# Patient Record
Sex: Female | Born: 1935 | Race: White | Hispanic: No | Marital: Married | Smoking: Never smoker
Health system: Southern US, Community
[De-identification: ages and names within clinical notes are randomized; demographics above are authoritative.]

## PROBLEM LIST (undated history)

## (undated) HISTORY — PX: VARICOSE VEIN SURGERY: SHX832

## (undated) HISTORY — PX: TONSILLECTOMY: SUR1361

---

## 1957-05-05 HISTORY — PX: THYROIDECTOMY: SHX17

## 1999-12-02 ENCOUNTER — Other Ambulatory Visit: Admission: RE | Admit: 1999-12-02 | Discharge: 1999-12-02 | Payer: Self-pay | Admitting: Internal Medicine

## 2000-02-14 ENCOUNTER — Encounter: Admission: RE | Admit: 2000-02-14 | Discharge: 2000-02-14 | Payer: Self-pay | Admitting: Internal Medicine

## 2000-02-14 ENCOUNTER — Encounter: Payer: Self-pay | Admitting: Internal Medicine

## 2001-04-26 ENCOUNTER — Encounter: Admission: RE | Admit: 2001-04-26 | Discharge: 2001-04-26 | Payer: Self-pay | Admitting: Internal Medicine

## 2001-04-26 ENCOUNTER — Encounter: Payer: Self-pay | Admitting: Internal Medicine

## 2002-05-02 ENCOUNTER — Encounter: Admission: RE | Admit: 2002-05-02 | Discharge: 2002-05-02 | Payer: Self-pay | Admitting: Internal Medicine

## 2002-05-02 ENCOUNTER — Encounter: Payer: Self-pay | Admitting: Internal Medicine

## 2004-03-01 ENCOUNTER — Other Ambulatory Visit: Admission: RE | Admit: 2004-03-01 | Discharge: 2004-03-01 | Payer: Self-pay | Admitting: Family Medicine

## 2004-03-21 ENCOUNTER — Ambulatory Visit: Payer: Self-pay | Admitting: Internal Medicine

## 2004-05-20 ENCOUNTER — Ambulatory Visit: Payer: Self-pay | Admitting: Internal Medicine

## 2004-06-06 ENCOUNTER — Ambulatory Visit: Payer: Self-pay | Admitting: Gastroenterology

## 2004-06-06 ENCOUNTER — Encounter: Admission: RE | Admit: 2004-06-06 | Discharge: 2004-06-06 | Payer: Self-pay | Admitting: Internal Medicine

## 2004-06-24 ENCOUNTER — Encounter (INDEPENDENT_AMBULATORY_CARE_PROVIDER_SITE_OTHER): Payer: Self-pay | Admitting: *Deleted

## 2004-06-24 ENCOUNTER — Encounter: Admission: RE | Admit: 2004-06-24 | Discharge: 2004-06-24 | Payer: Self-pay | Admitting: Internal Medicine

## 2004-07-03 ENCOUNTER — Encounter: Admission: RE | Admit: 2004-07-03 | Discharge: 2004-07-03 | Payer: Self-pay | Admitting: General Surgery

## 2004-07-04 ENCOUNTER — Encounter: Admission: RE | Admit: 2004-07-04 | Discharge: 2004-07-04 | Payer: Self-pay | Admitting: General Surgery

## 2004-07-08 ENCOUNTER — Encounter (INDEPENDENT_AMBULATORY_CARE_PROVIDER_SITE_OTHER): Payer: Self-pay | Admitting: *Deleted

## 2004-07-08 ENCOUNTER — Encounter: Admission: RE | Admit: 2004-07-08 | Discharge: 2004-07-08 | Payer: Self-pay | Admitting: General Surgery

## 2004-07-08 ENCOUNTER — Ambulatory Visit (HOSPITAL_COMMUNITY): Admission: RE | Admit: 2004-07-08 | Discharge: 2004-07-08 | Payer: Self-pay | Admitting: General Surgery

## 2004-07-08 ENCOUNTER — Ambulatory Visit (HOSPITAL_BASED_OUTPATIENT_CLINIC_OR_DEPARTMENT_OTHER): Admission: RE | Admit: 2004-07-08 | Discharge: 2004-07-08 | Payer: Self-pay | Admitting: General Surgery

## 2004-07-09 ENCOUNTER — Ambulatory Visit: Payer: Self-pay | Admitting: Oncology

## 2004-07-22 ENCOUNTER — Ambulatory Visit: Admission: RE | Admit: 2004-07-22 | Discharge: 2004-09-17 | Payer: Self-pay | Admitting: *Deleted

## 2004-09-27 ENCOUNTER — Ambulatory Visit: Payer: Self-pay | Admitting: Oncology

## 2005-01-16 ENCOUNTER — Encounter: Admission: RE | Admit: 2005-01-16 | Discharge: 2005-01-16 | Payer: Self-pay | Admitting: General Surgery

## 2005-03-19 ENCOUNTER — Ambulatory Visit: Payer: Self-pay | Admitting: Family Medicine

## 2005-06-23 ENCOUNTER — Encounter: Admission: RE | Admit: 2005-06-23 | Discharge: 2005-06-23 | Payer: Self-pay | Admitting: Oncology

## 2005-08-11 ENCOUNTER — Ambulatory Visit: Payer: Self-pay | Admitting: Internal Medicine

## 2005-09-25 ENCOUNTER — Ambulatory Visit: Payer: Self-pay | Admitting: Oncology

## 2005-11-11 ENCOUNTER — Ambulatory Visit: Payer: Self-pay | Admitting: Oncology

## 2005-11-11 LAB — CBC WITH DIFFERENTIAL/PLATELET
EOS%: 2.7 % (ref 0.0–7.0)
Eosinophils Absolute: 0.1 10*3/uL (ref 0.0–0.5)
LYMPH%: 27.8 % (ref 14.0–48.0)
MCH: 30.6 pg (ref 26.0–34.0)
MCHC: 33.6 g/dL (ref 32.0–36.0)
MCV: 91 fL (ref 81.0–101.0)
MONO%: 9.4 % (ref 0.0–13.0)
NEUT#: 2.7 10*3/uL (ref 1.5–6.5)
Platelets: 192 10*3/uL (ref 145–400)
RBC: 4.6 10*6/uL (ref 3.70–5.32)

## 2006-07-24 ENCOUNTER — Encounter: Admission: RE | Admit: 2006-07-24 | Discharge: 2006-07-24 | Payer: Self-pay | Admitting: General Surgery

## 2006-08-07 ENCOUNTER — Ambulatory Visit: Payer: Self-pay | Admitting: Internal Medicine

## 2006-08-07 LAB — CONVERTED CEMR LAB
ALT: 20 units/L (ref 0–40)
Glucose, Bld: 96 mg/dL (ref 70–99)
HDL: 64.1 mg/dL (ref >39.0)
LDL Cholesterol: 115 mg/dL — ABNORMAL HIGH (ref 0–99)
TSH: 1.82 microintl units/mL (ref 0.35–5.50)
Triglycerides: 80 mg/dL (ref 0–149)
VLDL: 16 mg/dL (ref 0–40)

## 2006-09-22 DIAGNOSIS — I10 Essential (primary) hypertension: Secondary | ICD-10-CM | POA: Insufficient documentation

## 2006-09-22 DIAGNOSIS — K5289 Other specified noninfective gastroenteritis and colitis: Secondary | ICD-10-CM

## 2006-09-22 DIAGNOSIS — E039 Hypothyroidism, unspecified: Secondary | ICD-10-CM | POA: Insufficient documentation

## 2006-09-22 DIAGNOSIS — E785 Hyperlipidemia, unspecified: Secondary | ICD-10-CM | POA: Insufficient documentation

## 2006-09-22 DIAGNOSIS — I440 Atrioventricular block, first degree: Secondary | ICD-10-CM | POA: Insufficient documentation

## 2006-09-22 DIAGNOSIS — Z8719 Personal history of other diseases of the digestive system: Secondary | ICD-10-CM

## 2006-10-12 ENCOUNTER — Encounter: Payer: Self-pay | Admitting: Internal Medicine

## 2006-10-12 ENCOUNTER — Encounter: Admission: RE | Admit: 2006-10-12 | Discharge: 2006-10-12 | Payer: Self-pay | Admitting: Internal Medicine

## 2006-10-27 ENCOUNTER — Ambulatory Visit: Payer: Self-pay | Admitting: Internal Medicine

## 2006-10-27 DIAGNOSIS — M810 Age-related osteoporosis without current pathological fracture: Secondary | ICD-10-CM | POA: Insufficient documentation

## 2006-11-02 ENCOUNTER — Encounter (INDEPENDENT_AMBULATORY_CARE_PROVIDER_SITE_OTHER): Payer: Self-pay | Admitting: *Deleted

## 2006-11-23 ENCOUNTER — Ambulatory Visit: Payer: Self-pay | Admitting: Oncology

## 2006-11-26 ENCOUNTER — Encounter: Payer: Self-pay | Admitting: Internal Medicine

## 2006-11-26 LAB — CBC WITH DIFFERENTIAL/PLATELET
BASO%: 0.3 % (ref 0.0–2.0)
LYMPH%: 24 % (ref 14.0–48.0)
MCHC: 34.5 g/dL (ref 32.0–36.0)
MONO#: 0.4 10*3/uL (ref 0.1–0.9)
MONO%: 8 % (ref 0.0–13.0)
Platelets: 177 10*3/uL (ref 145–400)
RBC: 4.61 10*6/uL (ref 3.70–5.32)
RDW: 12.9 % (ref 11.3–14.5)
WBC: 5.5 10*3/uL (ref 3.9–10.0)

## 2006-11-26 LAB — COMPREHENSIVE METABOLIC PANEL
ALT: 15 U/L (ref 0–35)
Alkaline Phosphatase: 52 U/L (ref 39–117)
CO2: 23 mEq/L (ref 19–32)
Sodium: 139 mEq/L (ref 135–145)
Total Bilirubin: 0.7 mg/dL (ref 0.3–1.2)
Total Protein: 6.6 g/dL (ref 6.0–8.3)

## 2007-03-01 ENCOUNTER — Telehealth: Payer: Self-pay | Admitting: Internal Medicine

## 2007-05-06 HISTORY — PX: COLONOSCOPY: SHX174

## 2007-05-20 ENCOUNTER — Encounter: Payer: Self-pay | Admitting: Internal Medicine

## 2007-08-13 ENCOUNTER — Encounter: Admission: RE | Admit: 2007-08-13 | Discharge: 2007-08-13 | Payer: Self-pay | Admitting: Oncology

## 2007-11-19 ENCOUNTER — Ambulatory Visit: Payer: Self-pay | Admitting: Oncology

## 2007-11-24 LAB — CBC WITH DIFFERENTIAL/PLATELET
Eosinophils Absolute: 0.1 10*3/uL (ref 0.0–0.5)
MONO#: 0.4 10*3/uL (ref 0.1–0.9)
NEUT#: 2.3 10*3/uL (ref 1.5–6.5)
RBC: 4.6 10*6/uL (ref 3.70–5.32)
RDW: 13.3 % (ref 11.3–14.5)
WBC: 3.7 10*3/uL — ABNORMAL LOW (ref 3.9–10.0)

## 2007-11-25 LAB — COMPREHENSIVE METABOLIC PANEL
ALT: 14 U/L (ref 0–35)
AST: 18 U/L (ref 0–37)
Alkaline Phosphatase: 52 U/L (ref 39–117)
CO2: 26 mEq/L (ref 19–32)
Calcium: 9.8 mg/dL (ref 8.4–10.5)
Creatinine, Ser: 0.83 mg/dL (ref 0.40–1.20)
Glucose, Bld: 88 mg/dL (ref 70–99)
Potassium: 4.4 mEq/L (ref 3.5–5.3)
Sodium: 140 mEq/L (ref 135–145)
Total Bilirubin: 0.8 mg/dL (ref 0.3–1.2)

## 2007-11-25 LAB — CANCER ANTIGEN 27.29: CA 27.29: 12 U/mL (ref 0–39)

## 2007-11-30 ENCOUNTER — Encounter: Payer: Self-pay | Admitting: Internal Medicine

## 2007-12-23 ENCOUNTER — Telehealth (INDEPENDENT_AMBULATORY_CARE_PROVIDER_SITE_OTHER): Payer: Self-pay | Admitting: *Deleted

## 2007-12-24 ENCOUNTER — Ambulatory Visit: Payer: Self-pay | Admitting: Internal Medicine

## 2007-12-29 ENCOUNTER — Ambulatory Visit: Payer: Self-pay | Admitting: Internal Medicine

## 2007-12-29 DIAGNOSIS — Z853 Personal history of malignant neoplasm of breast: Secondary | ICD-10-CM

## 2007-12-29 DIAGNOSIS — E559 Vitamin D deficiency, unspecified: Secondary | ICD-10-CM | POA: Insufficient documentation

## 2007-12-31 LAB — CONVERTED CEMR LAB
ALT: 20 units/L (ref 0–35)
Albumin: 4.4 g/dL (ref 3.5–5.2)
Alkaline Phosphatase: 53 units/L (ref 39–117)
Bilirubin, Direct: 0.1 mg/dL (ref 0.0–0.3)
Cholesterol: 184 mg/dL (ref 0–200)
Potassium: 5.9 meq/L — ABNORMAL HIGH (ref 3.5–5.3)
Total Protein: 6.8 g/dL (ref 6.0–8.3)
VLDL: 19 mg/dL (ref 0–40)

## 2008-03-06 ENCOUNTER — Telehealth: Payer: Self-pay | Admitting: Internal Medicine

## 2008-03-07 ENCOUNTER — Ambulatory Visit: Payer: Self-pay | Admitting: Internal Medicine

## 2008-03-10 ENCOUNTER — Telehealth (INDEPENDENT_AMBULATORY_CARE_PROVIDER_SITE_OTHER): Payer: Self-pay | Admitting: *Deleted

## 2008-04-04 ENCOUNTER — Ambulatory Visit: Payer: Self-pay | Admitting: Internal Medicine

## 2008-04-04 LAB — CONVERTED CEMR LAB: TSH: 1.21 microintl units/mL (ref 0.35–5.50)

## 2008-04-11 ENCOUNTER — Ambulatory Visit: Payer: Self-pay | Admitting: Internal Medicine

## 2008-06-05 ENCOUNTER — Telehealth (INDEPENDENT_AMBULATORY_CARE_PROVIDER_SITE_OTHER): Payer: Self-pay | Admitting: *Deleted

## 2008-08-14 ENCOUNTER — Encounter: Admission: RE | Admit: 2008-08-14 | Discharge: 2008-08-14 | Payer: Self-pay | Admitting: Oncology

## 2008-10-31 ENCOUNTER — Ambulatory Visit: Payer: Self-pay | Admitting: Internal Medicine

## 2008-11-01 ENCOUNTER — Encounter: Payer: Self-pay | Admitting: Internal Medicine

## 2008-11-01 LAB — CONVERTED CEMR LAB: Vit D, 25-Hydroxy: 31 ng/mL (ref 30–89)

## 2008-11-02 ENCOUNTER — Encounter (INDEPENDENT_AMBULATORY_CARE_PROVIDER_SITE_OTHER): Payer: Self-pay | Admitting: *Deleted

## 2008-11-07 ENCOUNTER — Ambulatory Visit: Payer: Self-pay | Admitting: Internal Medicine

## 2008-12-08 ENCOUNTER — Ambulatory Visit: Payer: Self-pay | Admitting: Oncology

## 2008-12-12 LAB — CBC WITH DIFFERENTIAL/PLATELET
Basophils Absolute: 0 10*3/uL (ref 0.0–0.1)
Eosinophils Absolute: 0.1 10*3/uL (ref 0.0–0.5)
HCT: 41.7 % (ref 34.8–46.6)
HGB: 13.8 g/dL (ref 11.6–15.9)
MCV: 89.6 fL (ref 79.5–101.0)
MONO%: 8.8 % (ref 0.0–14.0)
NEUT#: 2.7 10*3/uL (ref 1.5–6.5)
NEUT%: 62 % (ref 38.4–76.8)
RDW: 13.7 % (ref 11.2–14.5)

## 2008-12-12 LAB — COMPREHENSIVE METABOLIC PANEL
Albumin: 4 g/dL (ref 3.5–5.2)
Alkaline Phosphatase: 48 U/L (ref 39–117)
BUN: 10 mg/dL (ref 6–23)
Calcium: 9.7 mg/dL (ref 8.4–10.5)
Chloride: 106 mEq/L (ref 96–112)
Glucose, Bld: 75 mg/dL (ref 70–99)
Potassium: 3.7 mEq/L (ref 3.5–5.3)

## 2008-12-19 ENCOUNTER — Encounter: Payer: Self-pay | Admitting: Internal Medicine

## 2009-01-30 ENCOUNTER — Ambulatory Visit: Payer: Self-pay | Admitting: Internal Medicine

## 2009-02-04 LAB — CONVERTED CEMR LAB: Vit D, 25-Hydroxy: 39 ng/mL (ref 30–89)

## 2009-02-05 ENCOUNTER — Encounter (INDEPENDENT_AMBULATORY_CARE_PROVIDER_SITE_OTHER): Payer: Self-pay | Admitting: *Deleted

## 2009-02-06 ENCOUNTER — Ambulatory Visit: Payer: Self-pay | Admitting: Internal Medicine

## 2009-02-08 ENCOUNTER — Telehealth (INDEPENDENT_AMBULATORY_CARE_PROVIDER_SITE_OTHER): Payer: Self-pay | Admitting: *Deleted

## 2009-04-10 ENCOUNTER — Ambulatory Visit: Payer: Self-pay | Admitting: Internal Medicine

## 2009-04-12 LAB — CONVERTED CEMR LAB: TSH: 0.13 microintl units/mL — ABNORMAL LOW (ref 0.35–5.50)

## 2009-04-13 ENCOUNTER — Telehealth (INDEPENDENT_AMBULATORY_CARE_PROVIDER_SITE_OTHER): Payer: Self-pay | Admitting: *Deleted

## 2009-04-13 ENCOUNTER — Encounter (INDEPENDENT_AMBULATORY_CARE_PROVIDER_SITE_OTHER): Payer: Self-pay | Admitting: *Deleted

## 2009-06-22 ENCOUNTER — Ambulatory Visit: Payer: Self-pay | Admitting: Internal Medicine

## 2009-06-25 LAB — CONVERTED CEMR LAB: TSH: 0.53 microintl units/mL (ref 0.35–5.50)

## 2009-07-16 ENCOUNTER — Telehealth (INDEPENDENT_AMBULATORY_CARE_PROVIDER_SITE_OTHER): Payer: Self-pay | Admitting: *Deleted

## 2009-07-24 ENCOUNTER — Encounter: Admission: RE | Admit: 2009-07-24 | Discharge: 2009-07-24 | Payer: Self-pay | Admitting: Internal Medicine

## 2009-07-24 ENCOUNTER — Encounter: Payer: Self-pay | Admitting: Internal Medicine

## 2009-08-07 ENCOUNTER — Ambulatory Visit: Payer: Self-pay | Admitting: Internal Medicine

## 2009-08-15 ENCOUNTER — Encounter: Admission: RE | Admit: 2009-08-15 | Discharge: 2009-08-15 | Payer: Self-pay | Admitting: Internal Medicine

## 2009-08-20 LAB — CONVERTED CEMR LAB
TSH: 3.24 microintl units/mL (ref 0.35–5.50)
Vit D, 25-Hydroxy: 31 ng/mL (ref 30–89)

## 2009-11-20 ENCOUNTER — Telehealth (INDEPENDENT_AMBULATORY_CARE_PROVIDER_SITE_OTHER): Payer: Self-pay | Admitting: *Deleted

## 2009-12-07 ENCOUNTER — Ambulatory Visit: Payer: Self-pay | Admitting: Internal Medicine

## 2009-12-07 DIAGNOSIS — R413 Other amnesia: Secondary | ICD-10-CM

## 2009-12-07 DIAGNOSIS — H9190 Unspecified hearing loss, unspecified ear: Secondary | ICD-10-CM | POA: Insufficient documentation

## 2009-12-17 ENCOUNTER — Ambulatory Visit: Payer: Self-pay | Admitting: Oncology

## 2009-12-19 ENCOUNTER — Encounter: Payer: Self-pay | Admitting: Internal Medicine

## 2009-12-19 LAB — CBC WITH DIFFERENTIAL/PLATELET
EOS%: 2.6 % (ref 0.0–7.0)
Eosinophils Absolute: 0.1 10*3/uL (ref 0.0–0.5)
LYMPH%: 26.3 % (ref 14.0–49.7)
MCH: 30.7 pg (ref 25.1–34.0)
MCV: 90.6 fL (ref 79.5–101.0)
MONO%: 8.9 % (ref 0.0–14.0)
NEUT#: 2.8 10*3/uL (ref 1.5–6.5)
Platelets: 179 10*3/uL (ref 145–400)
RBC: 4.56 10*6/uL (ref 3.70–5.45)

## 2009-12-19 LAB — COMPREHENSIVE METABOLIC PANEL
Alkaline Phosphatase: 40 U/L (ref 39–117)
BUN: 12 mg/dL (ref 6–23)
Glucose, Bld: 90 mg/dL (ref 70–99)
Sodium: 139 mEq/L (ref 135–145)
Total Bilirubin: 0.9 mg/dL (ref 0.3–1.2)
Total Protein: 7.2 g/dL (ref 6.0–8.3)

## 2009-12-21 ENCOUNTER — Telehealth (INDEPENDENT_AMBULATORY_CARE_PROVIDER_SITE_OTHER): Payer: Self-pay | Admitting: *Deleted

## 2010-02-19 ENCOUNTER — Ambulatory Visit: Payer: Self-pay | Admitting: Internal Medicine

## 2010-03-27 ENCOUNTER — Ambulatory Visit: Payer: Self-pay | Admitting: Internal Medicine

## 2010-04-01 LAB — CONVERTED CEMR LAB: TSH: 0.72 microintl units/mL (ref 0.35–5.50)

## 2010-06-02 LAB — CONVERTED CEMR LAB
ALT: 24 units/L (ref 0–35)
AST: 36 units/L (ref 0–37)
Alkaline Phosphatase: 49 units/L (ref 39–117)
Chloride: 103 meq/L (ref 96–112)
Creatinine, Ser: 0.9 mg/dL (ref 0.4–1.2)
Eosinophils Absolute: 0.1 10*3/uL (ref 0.0–0.7)
GFR calc non Af Amer: 64.27 mL/min (ref >60)
Hemoglobin: 14.5 g/dL (ref 12.0–15.0)
Lymphocytes Relative: 24 % (ref 12.0–46.0)
MCHC: 34.1 g/dL (ref 30.0–36.0)
Monocytes Absolute: 0.4 10*3/uL (ref 0.1–1.0)
Neutro Abs: 3.3 10*3/uL (ref 1.4–7.7)
Potassium: 4.3 meq/L (ref 3.5–5.1)
TSH: 5.87 microintl units/mL — ABNORMAL HIGH (ref 0.35–5.50)
Total Protein: 7.3 g/dL (ref 6.0–8.3)
Triglycerides: 77 mg/dL (ref 0.0–149.0)
Vitamin B-12: >1500 pg/mL — ABNORMAL HIGH (ref 211–911)
WBC: 5.1 10*3/uL (ref 4.5–10.5)

## 2010-06-04 NOTE — Assessment & Plan Note (Signed)
Summary: MED REFILL////SPH   Vital Signs:  Patient profile:   77 year old female Height:      66 inches Weight:      147.4 pounds BMI:     23.88 Temp:     98.1 degrees F oral Pulse rate:   60 / minute Resp:     16 per minute BP sitting:   130 / 80  (left arm) Cuff size:   regular  Vitals Entered By: Shonna Chock CMA (December 07, 2009 11:12 AM) CC: Yearly follow-up, ? Labs (patient had a small cup of grape jucie)   CC:  Yearly follow-up and ? Labs (patient had a small cup of grape jucie).  History of Present Illness: Here for Medicare AWV: 1.Risk factors based on Past M, S, F history:hypothyroidism, HTN,Osteoporosis( Chart updated) 2.Physical Activities: see data 3.Depression/mood: denied 4.Hearing: whisper not heard @ 6 ft 5.ADL's: no limitations 6.Fall Risk: denied 7.Home Safety: none identified  8.Height, weight, &visual acuity:wall chart read @ 6 ft 9.Counseling: none requested  10.Labs ordered based on risk factors: see Orders 11. Referral Coordination: Audiology referral recommended 12. Care Plan: see Instructions 13. Cognitive Assessment: oriented X 3; memory intact but unable to recall 3 items initially but intact  with second attempt later ;she completes crossword puzzles & she subs @ school; "WORLD " spelled backwards; normal affect & mood  Hypertension Follow-Up      This is a 77 year old woman who presents for Hypertension follow-up.  The patient denies lightheadedness, urinary frequency, headaches, rash, and fatigue.  The patient denies the following associated symptoms: chest pain, chest pressure, exercise intolerance, dyspnea, palpitations, syncope, leg edema, and pedal edema.  Compliance with medications (by patient report) has been near 100%.  The patient reports that dietary compliance has been fair.  Adjunctive measures currently used by the patient include salt restriction.    Preventive Screening-Counseling & Management  Alcohol-Tobacco     Alcohol  drinks/day: 0     Smoking Status: never  Caffeine-Diet-Exercise     Caffeine use/day: 3-4 cups/ day     Diet Comments: no diet     Does Patient Exercise: yes     Type of exercise: calisthentics     Exercise (avg: min/session): <30     Times/week: 7  Hep-HIV-STD-Contraception     Dental Visit-last 6 months no     Dental Care Counseling: appt recommended     Sun Exposure-Excessive: no  Safety-Violence-Falls     Seat Belt Use: yes     Smoke Detectors: yes     Violence in the Home: no risk noted     Sexual Abuse: no      Sexual History:  currently monogamous.        Blood Transfusions:  no.        Travel History:  none.    Allergies: 1)  ! Percocet 2)  ! Codeine 3)  ! Percodan  Past History:  Past Medical History: Hyperlipidemia Hypertension Osteopenia Hypothyroidism Breast cancer,PMH  of, Dr Darnelle Catalan; PMH of goiter, S/P  post 80% thyroidectomy  Past Surgical History: VEINS STRIPPING (bilaterally) LEG TRAUMA,PMH of , no  DVT Lumpectomy 2005 Colonoscopy 2009 negative, Dr Meisinheimer, M[facility] Haagensen, [place] Thyroidectomy (4/5 resection for goiter) ? 1959 (no cancer); no PMH of neck/ face radiation Tonsillectomy  Family History: Father: thyroid disease, MI in 13s Mother: breast cancer PGM : oral cancer  Social History: Smoking Status:  never Caffeine use/day:  3-4 cups/ day Does Patient Exercise:  yes Dental Care w/in 6 mos.:  no Sun Exposure-Excessive:  no Seat Belt Use:  yes Sexual History:  currently monogamous Blood Transfusions:  no  Review of Systems  The patient denies anorexia, fever, weight loss, weight gain, hoarseness, prolonged cough, hemoptysis, abdominal pain, melena, hematochezia, severe indigestion/heartburn, hematuria, incontinence, suspicious skin lesions, depression, unusual weight change, abnormal bleeding, enlarged lymph nodes, and angioedema.    Physical Exam  General:  Appears younger than age,well-nourished; alert,appropriate and  cooperative throughout examination Head:  Normocephalic and atraumatic without obvious abnormalities. Eyes:  No corneal or conjunctival inflammation noted.Slight ptosis OD.Perrla. Funduscopic exam benign, without hemorrhages, exudates or papilledema. Vision grossly normal. Ears:  External ear exam shows no significant lesions or deformities.  Otoscopic examination reveals clear canals, tympanic membranes are intact bilaterally without bulging, retraction, inflammation or discharge. Slight amount  wax R canal Nose:  External nasal examination shows no deformity or inflammation. Nasal mucosa are pink and moist without lesions or exudates. Mouth:  Oral mucosa and oropharynx without lesions or exudates.  Teeth in good repair. Neck:  No deformities, masses, or tenderness noted. Op scar anteriorly Chest Wall:  kyphosis.   Lungs:  Normal respiratory effort, chest expands symmetrically. Lungs are clear to auscultation, no crackles or wheezes. Heart:  normal rate, regular rhythm, no gallop, no rub, no JVD, no HJR, and grade  1/2/6 systolic murmur.  Slurring of S2 Abdomen:  Bowel sounds positive,abdomen soft and non-tender without masses, organomegaly or hernias noted. Pulses:  R and L carotid,radial,dorsalis pedis and posterior tibial pulses are full and equal bilaterally Extremities:  No clubbing, cyanosis, edema. OA DIP changes Neurologic:  DTRs symmetrical and normal.   No tremor  Skin:  Intact without suspicious lesions or rashes Cervical Nodes:  No lymphadenopathy noted Axillary Nodes:  No palpable lymphadenopathy Psych:  normally interactive, good eye contact, and not anxious appearing.     Impression & Recommendations:  Problem # 1:  PREVENTIVE HEALTH CARE (ICD-V70.0)  Orders: Subsequent annual wellness visit with prevention plan (Z6109) TLB-CBC Platelet - w/Differential (85025-CBCD)  Problem # 2:  HEARING LOSS, BILATERAL (ICD-389.9)  Problem # 3:  MEMORY LOSS (ICD-780.93)  ? related to  hearing loss & inattention  Orders: Venipuncture (60454) TLB-B12, Serum-Total ONLY (09811-B14)  Problem # 4:  HYPOTHYROIDISM (ICD-244.9)  Her updated medication list for this problem includes:    Armour Thyroid 120 Mg Tabs (Thyroid) .Marland Kitchen... 1 pill once daily except 1/2 on  tues & sat(tsh was 0.50)    Armour Thyroid 60 Mg Tabs (Thyroid) .Marland Kitchen... Take 2 tablets monday, wednesday, and thursday. all other days take 1 tablet.  Orders: Venipuncture (78295) TLB-TSH (Thyroid Stimulating Hormone) (84443-TSH)  Problem # 5:  VITAMIN D DEFICIENCY (ICD-268.9)  Problem # 6:  HYPERLIPIDEMIA (ICD-272.4)  Orders: Venipuncture (62130) TLB-Lipid Panel (80061-LIPID) TLB-Hepatic/Liver Function Pnl (80076-HEPATIC)  Complete Medication List: 1)  Fosamax 70 Mg Tabs (Alendronate sodium) .Marland Kitchen.. 1 by mouth weekly, 2)  Toprol Xl 50 Mg Tb24 (Metoprolol succinate) .... 1/2 by mouth once daily 3)  Arimidex 1 Mg Tabs (Anastrozole) .Marland Kitchen.. 1 by mouth qd 4)  Vit B12 2000mg  Daily  5)  Vit C 500mg   6)  Cvs Calcium-600 Tabs (Calcium carbonate tabs) 7)  Folic Acid 400iuqd  8)  Fish Oil  9)  Glucosamine  10)  Flax Seed Oil 1000mg  Qd  11)  Armour Thyroid 120 Mg Tabs (Thyroid) .Marland Kitchen.. 1 pill once daily except 1/2 on  tues & sat(tsh was 0.50) 12)  Vitamin E Complex 400  Unit Caps (Vitamin e) .Marland Kitchen.. 1 by mouth once daily 13)  Armour Thyroid 60 Mg Tabs (Thyroid) .... Take 2 tablets monday, wednesday, and thursday. all other days take 1 tablet.  Other Orders: Tdap => 48yrs IM (16109) Admin 1st Vaccine (60454) TLB-BMP (Basic Metabolic Panel-BMET) (80048-METABOL) EKG w/ Interpretation (93000)  Patient Instructions: 1)  Consider Audiology referral for hearing assessment .It is important that you exercise regularly at least 20 minutes 5 times a week. If you develop chest pain, have severe difficulty breathing, or feel very tired , stop exercising immediately and seek medical attention. 2)  Take an  81 mg coated Aspirin every day.  Please make an appt  to revisit the memory issue in  3 -4 months. Memory exercises as discussed.   Immunizations Administered:  Tetanus Vaccine:    Vaccine Type: Tdap    Site: right deltoid    Mfr: GlaxoSmithKline    Dose: 0.5 ml    Route: IM    Given by: Shonna Chock CMA    Exp. Date: 07/28/2011    Lot #: [id number]    VIS given: 03/23/07 version given December 07, 2009.

## 2010-06-04 NOTE — Progress Notes (Signed)
Summary: ADVISED PT ABOUT PYMNT BY INS, ADDED LAB NOTES  Phone Note Call from Patient Call back at Home Phone [phone]   Caller: Spouse Summary of Call: HAS YEARLY APPOINTMENT / MED REFILL APPT WITH DR HOPPER FOR 12/07/2009  MADE LAB APPT FOR 11/30/2009---PLEASE LET ME KNOW WHAT LABS TO PUT IN FOR THIS PATIENT  Initial call taken by: Jerolyn Shin,  November 20, 2009 10:47 AM  Follow-up for Phone Call        Please try to remember this for future ref: age 75 and older patient's risk the chance of insurance not covering labs prior to appointment if Medicare. (patient should be told this)  Lipid,Hep,BMP,CBCD,Stool cards, Udip 401.9/272.4/995.20 Follow-up by: Shonna Chock CMA,  November 20, 2009 10:57 AM  Additional Follow-up for Phone Call Additional follow up Details #1::        SPOKE TO MS Maugeri--ADVISED HER TO CALL HER SECONDARY INSURANCE TO SEE IF THEY WILL PAY FOR LABS DONE BEFORE OFFICE VISIT---SHE UNDERSTOOD SITUATION --SAYS SHE WILL TELL MR Goswami WHO CAN CHECK ON THE INSURANCE  ADDED LAB INFO TO LAB VISIT Additional Follow-up by: Jerolyn Shin,  November 21, 2009 2:59 PM

## 2010-06-04 NOTE — Progress Notes (Signed)
Summary: Med Concerns  Phone Note Call from Patient   Caller: Patient Summary of Call: Message left on VM: Patient would like to discuss Fosamax RX, Please call. Monica Skinner  July 16, 2009 3:06 PM   I called patient's husband and discussed paitent is due for a BMD, last BMD was 10/2006. Patient's husband is ok with [place] setting up appointment. Patient only has two pills left and will need another RX sent in, DONE!! .Shonna Chock  July 16, 2009 3:19 PM     Prescriptions: FOSAMAX 70 MG TABS (ALENDRONATE SODIUM) 1 by mouth WEEKLY, DUE FOR BONE DENSITY FOR ADDITIONAL REFILLS  #4 x 0   Entered by:   Shonna Chock   Authorized by:   Marga Melnick MD   Signed by:   Shonna Chock on 07/16/2009   Method used:   Electronically to        [facility]  [id number] (retail)       [address]       [place], [place]  [address]       Ph: [phone] or [phone]       Fax: [phone]   RxID:   [id number]

## 2010-06-04 NOTE — Progress Notes (Signed)
Summary: med concerns  Phone Note Outgoing Call Call back at [facility] Phone [phone]   Call placed by: [facility] CMA,  December 21, 2009 10:01 AM Summary of Call: pt left VM that she had some concerns about med pt unsure of dose. pt not sure if she needs to be on ARMOUR THYROID 120 or 160. pls give her a call to clarify....................Marland KitchenFelecia Deloach CMA  December 21, 2009 10:03 AM    Follow-up for Phone Call        Patient's medication shows ARMOUR THYROID 120mg ..Harold Barban  December 21, 2009 1:58 PM  Patient is aware.Harold Barban  December 21, 2009 1:58 PM

## 2010-06-04 NOTE — Assessment & Plan Note (Signed)
Summary: flu shot//lch   Nurse Visit   Allergies: 1)  ! Percocet 2)  ! Codeine 3)  ! Percodan  Orders Added: 1)  Flu Vaccine 49yrs + MEDICARE PATIENTS [Q2039] 2)  Administration Flu vaccine - MCR [G0008] Flu Vaccine Consent Questions     Do you have a history of severe allergic reactions to this vaccine? no    Any prior history of allergic reactions to egg and/or gelatin? no    Do you have a sensitivity to the preservative Thimersol? no    Do you have a past history of Guillan-Barre Syndrome? no    Do you currently have an acute febrile illness? no    Have you ever had a severe reaction to latex? no    Vaccine information given and explained to patient? yes    Are you currently pregnant? no    Lot Number:[id number]   Exp Date:11/02/2010   Manufacturer: [employer]    Site Given  Left Deltoid IM.medflu

## 2010-06-04 NOTE — Letter (Signed)
Summary: [facility]  [facility]   Imported By: Lanelle Bal 01/23/2010 10:00:58  _____________________________________________________________________  External Attachment:    Type:   Image     Comment:   External Document

## 2010-06-20 ENCOUNTER — Other Ambulatory Visit: Payer: Self-pay | Admitting: Oncology

## 2010-06-20 ENCOUNTER — Encounter: Payer: Self-pay | Admitting: Internal Medicine

## 2010-06-20 ENCOUNTER — Other Ambulatory Visit: Payer: Self-pay | Admitting: Internal Medicine

## 2010-06-20 ENCOUNTER — Encounter (HOSPITAL_BASED_OUTPATIENT_CLINIC_OR_DEPARTMENT_OTHER): Payer: Medicare Other | Admitting: Oncology

## 2010-06-20 ENCOUNTER — Ambulatory Visit (INDEPENDENT_AMBULATORY_CARE_PROVIDER_SITE_OTHER): Payer: Medicare Other | Admitting: Internal Medicine

## 2010-06-20 DIAGNOSIS — R413 Other amnesia: Secondary | ICD-10-CM

## 2010-06-20 DIAGNOSIS — C50119 Malignant neoplasm of central portion of unspecified female breast: Secondary | ICD-10-CM

## 2010-06-20 DIAGNOSIS — Z170 Estrogen receptor positive status [ER+]: Secondary | ICD-10-CM

## 2010-06-20 DIAGNOSIS — I10 Essential (primary) hypertension: Secondary | ICD-10-CM

## 2010-06-20 DIAGNOSIS — E039 Hypothyroidism, unspecified: Secondary | ICD-10-CM

## 2010-06-20 LAB — CBC WITH DIFFERENTIAL/PLATELET
BASO%: 0.3 % (ref 0.0–2.0)
Basophils Absolute: 0 10*3/uL (ref 0.0–0.1)
EOS%: 2.5 % (ref 0.0–7.0)
MCH: 29.9 pg (ref 25.1–34.0)
MCHC: 33.6 g/dL (ref 31.5–36.0)
MCV: 89 fL (ref 79.5–101.0)
MONO%: 9 % (ref 0.0–14.0)
RBC: 4.67 10*6/uL (ref 3.70–5.45)
RDW: 12.9 % (ref 11.2–14.5)
lymph#: 1.2 10*3/uL (ref 0.9–3.3)

## 2010-06-20 LAB — VITAMIN B12: Vitamin B-12: >1500 pg/mL — ABNORMAL HIGH (ref 211–911)

## 2010-06-20 LAB — TSH: TSH: 3.73 u[IU]/mL (ref 0.35–5.50)

## 2010-06-21 ENCOUNTER — Ambulatory Visit (INDEPENDENT_AMBULATORY_CARE_PROVIDER_SITE_OTHER)
Admission: RE | Admit: 2010-06-21 | Discharge: 2010-06-21 | Disposition: A | Discharge status: Home / Self Care | Payer: Medicare Other | Source: Ambulatory Visit | Attending: Internal Medicine | Admitting: Internal Medicine

## 2010-06-21 DIAGNOSIS — R413 Other amnesia: Secondary | ICD-10-CM

## 2010-06-26 NOTE — Assessment & Plan Note (Addendum)
Summary: htn/memory loss/cbs ok kelly   Vital Signs:  Patient profile:   77 year old female Weight:      150.6 pounds BMI:     24.40 Pulse rate:   80 / minute Resp:     15 per minute BP sitting:   130 / 78  (left arm) Cuff size:   regular  Vitals Entered By: Shonna Chock CMA (June 20, 2010 1:30 PM) CC: Memory   CC:  Memory.  History of Present Illness:    Her husband is concerned about memory  loss; for example she misses exact date. 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 "The students want me to come back." She denies any Neurologic or Psych symptoms (see ROS).  No  PMH or FH of memory issues. MMSE completed : score 26 of 30  . Six animals named in 60 seconds ; several were repeated .  Clock Drawing Test could not be completed; numerals misplaced &  hands not added. No PMH of head trauma.                                                                                                                                                    BP @ home is 135-140/60-70s.She denies lightheadedness, urinary frequency, headaches, edema, and fatigue.  The patient denies the following associated symptoms: chest pain, chest pressure, dyspnea, palpitations, and syncope.  Compliance with medications (by patient report) has been near 100%.  The patient reports that dietary compliance has been good.  The patient reports exercising daily.  Adjunctive measures currently used by the patient include salt restriction.    Allergies: 1)  ! Percocet 2)  ! Codeine 3)  ! Percodan  Review of Systems GU:  Denies incontinence. Neuro:  Denies brief paralysis, disturbances in coordination, numbness, poor balance, tingling, tremors, and weakness. Psych:  Denies anxiety, depression, easily angered, easily tearful, and irritability.  Physical Exam  General:  well-nourished,in no acute distress; alert,appropriate and cooperative throughout  examination Eyes:  No corneal or conjunctival inflammation noted. EOMI. Perrla. Field of. Vision grossly normal. Ptosis OD Mouth:  Oral mucosa and oropharynx without lesions or exudates.  Teeth in good repair. No tongue deviation Heart:  normal rate, regular rhythm, no gallop, no rub, no JVD, and grade 1 /6  cresc-decrescendo  murmur LSB.   Pulses:  R and L carotid,radial,dorsalis pedis and posterior tibial pulses are full and equal bilaterally Neurologic:  see MMSE;cranial nerves II-XII intact, strength normal in all extremities, sensation intact to light touch, gait normal, and DTRs symmetrical and normal.   Skin:  Intact without suspicious lesions or rashes Cervical Nodes:  No lymphadenopathy noted Axillary Nodes:  No palpable lymphadenopathy Psych:  normally interactive, good eye contact, not anxious appearing,  and not depressed appearing.     Impression & Recommendations:  Problem # 1:  MEMORY LOSS (ICD-780.93)  Orders: Venipuncture (60454) TLB-TSH (Thyroid Stimulating Hormone) (84443-TSH) TLB-B12, Serum-Total ONLY (09811-B14) T-RPR (Syphilis) (78295-62130) Radiology Referral (Radiology)  Problem # 2:  HYPERTENSION (ICD-401.9)  controlled Her updated medication list for this problem includes:    Toprol Xl 50 Mg Tb24 (Metoprolol succinate) .Marland Kitchen... 1/2 by mouth once daily  Orders: Venipuncture (86578) TLB-BUN (Urea Nitrogen) (84520-BUN) TLB-Creatinine, Blood (82565-CREA)  Problem # 3:  HYPOTHYROIDISM (ICD-244.9)  The following medications were removed from the medication list:    Armour Thyroid 60 Mg Tabs (Thyroid) .Marland Kitchen... Take 2 tablets monday, wednesday, and thursday. all other days take 1 tablet. Her updated medication list for this problem includes:    Armour Thyroid 120 Mg Tabs (Thyroid) .Marland Kitchen... 1 pill once daily except 1/2 on   weds  Orders: TLB-TSH (Thyroid Stimulating Hormone) (84443-TSH)  Complete Medication List: 1)  Fosamax 70 Mg Tabs (Alendronate sodium) .Marland Kitchen.. 1  by mouth weekly, 2)  Toprol Xl 50 Mg Tb24 (Metoprolol succinate) .... 1/2 by mouth once daily 3)  Vit B12 2000mg  Daily  4)  Vit C 500mg   5)  Cvs Calcium-600 Tabs (Calcium carbonate tabs) 6)  Folic Acid 400iuqd  7)  Fish Oil  8)  Flax Seed Oil 1000mg  Qd  9)  Armour Thyroid 120 Mg Tabs (Thyroid) .Marland Kitchen.. 1 pill once daily except 1/2 on   weds 10)  Vitamin E Complex 400 Unit Caps (Vitamin e) .Marland Kitchen.. 1 by mouth once daily  Patient Instructions: 1)  Medication may be considered if all studies are negative or normal.   Orders Added: 1)  Est. Patient Level IV [46962] 2)  Venipuncture [95284] 3)  TLB-TSH (Thyroid Stimulating Hormone) [84443-TSH] 4)  TLB-B12, Serum-Total ONLY [82607-B12] 5)  T-RPR (Syphilis) [13244-01027] 6)  TLB-BUN (Urea Nitrogen) [84520-BUN] 7)  TLB-Creatinine, Blood [82565-CREA] 8)  Radiology Referral [Radiology]  Appended Document: htn/memory loss/cbs ok kelly

## 2010-07-02 NOTE — Letter (Signed)
Summary: MMSE  MMSE   Imported By: Maryln Gottron 06/25/2010 14:23:05  _____________________________________________________________________  External Attachment:    Type:   Image     Comment:   External Document

## 2010-08-30 ENCOUNTER — Other Ambulatory Visit: Payer: Self-pay | Admitting: Internal Medicine

## 2010-08-30 DIAGNOSIS — Z9889 Other specified postprocedural states: Secondary | ICD-10-CM

## 2010-09-03 ENCOUNTER — Ambulatory Visit
Admission: RE | Admit: 2010-09-03 | Discharge: 2010-09-03 | Disposition: A | Discharge status: Home / Self Care | Payer: Medicare Other | Source: Ambulatory Visit | Attending: Internal Medicine | Admitting: Internal Medicine

## 2010-09-03 DIAGNOSIS — Z9889 Other specified postprocedural states: Secondary | ICD-10-CM

## 2010-09-11 ENCOUNTER — Other Ambulatory Visit: Payer: Self-pay

## 2010-09-11 MED ORDER — ALENDRONATE SODIUM 70 MG PO TABS: 70.0000 mg | ORAL_TABLET | ORAL | Status: DC

## 2010-09-20 NOTE — Op Note (Signed)
[name], Monica Skinner                ACCOUNT NO.:  [id number]   MEDICAL RECORD NO.:  [id number]          PATIENT TYPE:  AMB   LOCATION:  [facility]                          FACILITY:  [facility]   PHYSICIAN:  Rose Phi. Maple Hudson, M.D.   DATE OF BIRTH:  05-22-1929   DATE OF PROCEDURE:  07/08/2004  DATE OF DISCHARGE:                                 OPERATIVE REPORT   PREOPERATIVE DIAGNOSIS:  Stage I carcinoma of the right breast.   POSTOPERATIVE DIAGNOSIS:  Stage I carcinoma of the right breast.   OPERATION PERFORMED:  Blue dye injection.  Right partial mastectomy with  needle localization and specimen mammogram and right axillary sentinel lymph  node biopsy.   SURGEON:  Rose Phi. Maple Hudson, M.D.   ANESTHESIA:  General.   DESCRIPTION OF PROCEDURE:  Prior to coming to the operating room, a  localizing wire had been placed at the lesion in the upper portion of the  right breast and 1 mCi of technetium sulfur colloid was injected  intradermally.  After suitable general anesthesia was induced, the patient  was placed in supine position with the arms extended on the arm board.  5 mL  of a mixture of 2 mL of methylene blue and 3 mL of injectable saline was  injected in the subareolar tissue and the breast gently massaged for three  minutes.   After prepping and draping, a curved incision in the upper portion of the  breast overlying the area marked by the localizing wire was then outlined  and then made.  A wide excision of the wire and surrounding tissue was  carried out.  Specimen oriented for the pathologist.  While that was being  evaluated by the radiologist.  To confirm the lesions removal, a short  transverse incision was made in the right axilla and careful dissection down  to the clavipectoral fascia and just deep to that revealed a blue lymphatic  going into two blue sentinel lymph nodes which we excised.   Radiologist reported that the lesion had been removed on the specimen  mammogram and both  the sentinel node and the lumpectomy specimens were  evaluated by the pathologist.   Sentinel nodes were negative and the margins were cleaned.   Because it looked like there was more, about 2 mm or so  at the cranial  margin, I excised some more of the cranial margin of the lumpectomy site.  Both incisions were injected with 0.25% Marcaine and then closed with 3-0  Vicryl and then subcuticular 4-0 Monocryl and Steri-Strips.  Dressings were  applied and the patient transferred to the recovery room in satisfactory  condition having tolerated the procedure well.      PRY/MEDQ  D:  07/08/2004  T:  07/08/2004  Job:  [id number]

## 2010-09-25 ENCOUNTER — Other Ambulatory Visit: Payer: Self-pay

## 2010-09-25 MED ORDER — THYROID 120 MG PO TABS: 120.0000 mg | ORAL_TABLET | ORAL | Status: DC

## 2010-10-02 ENCOUNTER — Encounter: Payer: Self-pay | Admitting: Internal Medicine

## 2010-10-02 ENCOUNTER — Ambulatory Visit (INDEPENDENT_AMBULATORY_CARE_PROVIDER_SITE_OTHER): Payer: Medicare Other | Admitting: Internal Medicine

## 2010-10-02 VITALS — BP 140/78 | HR 64 | Wt 144.8 lb

## 2010-10-02 DIAGNOSIS — R413 Other amnesia: Secondary | ICD-10-CM

## 2010-10-02 MED ORDER — DONEPEZIL HCL 5 MG PO TABS: 5.0000 mg | ORAL_TABLET | Freq: Every evening | ORAL | Status: DC | PRN

## 2010-10-02 NOTE — Progress Notes (Signed)
  Subjective:    Patient ID: Monica Skinner, female    DOB: [birth year], 77 y.o.   MRN: [id number]  HPIThe prescription for generic Aricept ran out 3 days ago; they question any significant benefit to date. Memory loss felt to be stable.   Constitutional: Weight change: no; Fatigue:no; Sleep pattern:no issues; Appetite:good  Visual change(blurred/diplopia/visual loss):no Hoarseness:no; Swallowing issues:no Cardiovascular:Chest pain: no,Palpitations:no; Racing:no; Irregularity:no GI: Constipation:no; Diarrhea:no Derm: Change in nails/hair/skin:no Neuro:Headaches: no; Numbness/tingling:no; Weakness: no;Tremor:no; Incontinence(stool/urinary): no Psych: Anxiety:no; Depression:no; Panic attacks:no Endo: Temperature intolerance: Heat:no; Cold:yes  No limitations of ADL    Review of Systems     Objective:   Physical Exam  Gen. appearance: Thin but well-nourished, in no distress Eyes: Extraocular motion intact, field of vision normal, no nystagmus Neck: Normal range of motion, no masses, normal thyroid Cardiovascular: Rate and rhythm normal; no gallops or extra heart sounds. Grade 1/6 systolic murmur Muscle skeletal: Range of motion, tone, &  strength normal Neuro:no cranial nerve deficit, deep tendon  reflexes normal, gait normal. "June ?, 12".She knew Obama was Pres. Subtraction : 4 of 5 correct. No recall for 3 items. Lymph: No cervical or axillary LA Skin: Warm and dry without suspicious lesions or rashes Psych: no anxiety or mood change. Normally interactive and cooperative.         Assessment & Plan:  #1 memory loss, ? stable  Plan: Renew Donepezil  and titrate to full dose.

## 2010-10-02 NOTE — Patient Instructions (Signed)
You will take the medication one by mouth daily as you have for 10 days then increase it to 2 daily. After this prescription runs out we will have the pharmacy change to a 10 mg pill.Recheck in 3 months

## 2010-10-03 ENCOUNTER — Telehealth: Payer: Self-pay | Admitting: Internal Medicine

## 2010-10-03 NOTE — Telephone Encounter (Signed)
See if 10 mg is covered #90

## 2010-10-03 NOTE — Telephone Encounter (Signed)
Dr.Hopper please rx alternative med, Aricept not covered

## 2010-10-04 MED ORDER — DONEPEZIL HCL 10 MG PO TABS: 10.0000 mg | ORAL_TABLET | Freq: Every day | ORAL | Status: DC

## 2010-10-04 NOTE — Telephone Encounter (Signed)
1.) Called the pharmacy to discuss if 10mg  tab will be covered, I was informed rx would have to be sent in first for them to run and determine coverage  2.) Called patient, spoke with patient's husband and informed him of conversation between me and pharmacy. New rx will be sent in for 10mg  tab, patient to check with pharmacy to see if covered and to call if any concerns. All information ok'd

## 2010-11-05 ENCOUNTER — Encounter: Payer: Self-pay | Admitting: Internal Medicine

## 2010-11-05 ENCOUNTER — Ambulatory Visit (INDEPENDENT_AMBULATORY_CARE_PROVIDER_SITE_OTHER): Payer: Medicare Other | Admitting: Internal Medicine

## 2010-11-05 DIAGNOSIS — M25519 Pain in unspecified shoulder: Secondary | ICD-10-CM

## 2010-11-05 NOTE — Assessment & Plan Note (Addendum)
Likely a sprain, rec coonservative treatment, at the same time range of motion exercises encouraged. See instructions

## 2010-11-05 NOTE — Patient Instructions (Signed)
No heavy lifting, do exercise your shoulder as recommended Tylenol 500 mg 1 or 2 tabs every 6 hours as needed Call in 1 week if no better

## 2010-11-05 NOTE — Progress Notes (Signed)
  Subjective:    Patient ID: Monica Skinner, female    DOB: 1929/06/23, 77 y.o.   MRN: [id number]  HPI 77 year old lady with history of hypertension, hyperlipidemia, osteoporosis and hypothyroidism. Sudden onset of L shoulder pain (deltoid area) 2 days ago after she lifted something that was not really heavy or unusual for her; pain is staedy and worse w/ arm motion   Review of Systems No neck pain or UE paresthesias No CP-SOB    Objective:   Physical Exam  Constitutional: She appears well-developed and well-nourished. No distress.  Neck:       symmetric ROM, palpation of Cspine w/o tenderness   Musculoskeletal:       Shoulder w/o deformities, palpation of AC joint B wnl; ROM symmetric, biceps normal to palpation  Neurological:       strenght extremities wnl          Assessment & Plan:

## 2011-01-21 ENCOUNTER — Ambulatory Visit (INDEPENDENT_AMBULATORY_CARE_PROVIDER_SITE_OTHER): Payer: Medicare Other | Admitting: Internal Medicine

## 2011-01-21 ENCOUNTER — Encounter: Payer: Self-pay | Admitting: Internal Medicine

## 2011-01-21 DIAGNOSIS — I10 Essential (primary) hypertension: Secondary | ICD-10-CM

## 2011-01-21 DIAGNOSIS — E559 Vitamin D deficiency, unspecified: Secondary | ICD-10-CM

## 2011-01-21 DIAGNOSIS — R413 Other amnesia: Secondary | ICD-10-CM

## 2011-01-21 DIAGNOSIS — E039 Hypothyroidism, unspecified: Secondary | ICD-10-CM

## 2011-01-21 DIAGNOSIS — F068 Other specified mental disorders due to known physiological condition: Secondary | ICD-10-CM

## 2011-01-21 LAB — BASIC METABOLIC PANEL
CO2: 26 mEq/L (ref 19–32)
Chloride: 104 mEq/L (ref 96–112)
GFR: 52.49 mL/min — ABNORMAL LOW (ref >60.00)
Glucose, Bld: 91 mg/dL (ref 70–99)
Potassium: 4.2 mEq/L (ref 3.5–5.1)
Sodium: 138 mEq/L (ref 135–145)

## 2011-01-21 MED ORDER — DONEPEZIL HCL 10 MG PO TABS: 10.0000 mg | ORAL_TABLET | Freq: Every day | ORAL | Status: DC

## 2011-01-21 NOTE — Progress Notes (Signed)
  Subjective:    Patient ID: Monica Skinner, female    DOB: [birth year], 77 y.o.   MRN: [id number]  HPI Memory Loss: Status:stable to minimally worse as per husband Impact: no limitations to ADL Associated Symptoms /Signs: Constitutional: no fever/chills/ sweats /fatigue/ sleep issues Vision changes:no Cardiovascular: no chest pain, palpitations/rhythm change GI:no  bowel changes Endo:no  temp intolerance/ skin, hair, nail changes Neuro:no  headaches/ numbness& tingling/ tremor/ weakness/ gait dysfunction: Social :tobacco/ alcohol:never       Review of Systems     Objective:   Physical Exam  Gen.: Thin but well-nourished; in no acute distress Eyes: Extraocular motion intact; no lid lag or proptosis Neck:n ROM WNL ; thyroid essentially absent Heart: Normal rhythm and rate without significant murmur, gallop.Slurring S2 Lungs: Chest : minmal rhonchi; no increased WOB Neuro:Deep tendon reflexes are equal and within normal limits; no tremor. Repeating same stories told last visit about HS BB , "15 years ago".  Skin: Warm and dry without significant lesions or rashes; no onycholysis WU:[id number] Psych: Normally communicative and interactive; no abnormal mood or affect clinically.   MMSE: 17 of 30     Assessment & Plan:   #1 dementia, ?stable to slight progression #2 Osteoporosis; ? > 5 years of Fosamax Plan: D/C Fosamax if taken > 5 years

## 2011-01-21 NOTE — Patient Instructions (Signed)
Stop Fosamax if taken  for > 5 years to date.

## 2011-01-31 ENCOUNTER — Telehealth: Payer: Self-pay

## 2011-01-31 NOTE — Telephone Encounter (Signed)
Med list updated, labs mailed

## 2011-01-31 NOTE — Telephone Encounter (Signed)
Message copied by Edgardo Roys on Fri Jan 31, 2011 11:18 AM ------      Message from: Pecola Lawless      Created: Sun Jan 26, 2011  7:50 AM       BUN, creatinine, and GFR  all assess kidney function. To protect the kidneys it  is important to control your blood pressure and sugar. You should also stay well hydrated. Drink to thirst, up to 40 ounces of water a day.      TSH (Thyroid Stimulating Hormone) normal range = 0.35- 5.50. Ideal value is 1-3. A  Value below 0.35 indicates excessive thyroid supplementation (HYPERthyroid state) . Please DECREASE the thyroid medication by 1/2 pill on Tues & Thurs.Recheck TSH in 8 weeks after dose decrease (244.9). Please bring these instructions to that Lab appt.       Please see me 2-3 days after repeat labs with actual thyroid pills. Hopp.

## 2011-02-19 ENCOUNTER — Other Ambulatory Visit: Payer: Self-pay | Admitting: Internal Medicine

## 2011-02-19 MED ORDER — THYROID 120 MG PO TABS: 120.0000 mg | ORAL_TABLET | ORAL | Status: DC

## 2011-02-19 NOTE — Telephone Encounter (Signed)
RX sent

## 2011-04-04 ENCOUNTER — Other Ambulatory Visit: Payer: Self-pay | Admitting: Internal Medicine

## 2011-04-07 ENCOUNTER — Other Ambulatory Visit: Payer: Self-pay | Admitting: Internal Medicine

## 2011-04-07 DIAGNOSIS — E039 Hypothyroidism, unspecified: Secondary | ICD-10-CM

## 2011-04-08 ENCOUNTER — Other Ambulatory Visit (INDEPENDENT_AMBULATORY_CARE_PROVIDER_SITE_OTHER): Payer: Medicare Other

## 2011-04-08 DIAGNOSIS — E039 Hypothyroidism, unspecified: Secondary | ICD-10-CM

## 2011-04-08 LAB — TSH: TSH: 0.02 u[IU]/mL — ABNORMAL LOW (ref 0.35–5.50)

## 2011-04-08 NOTE — Progress Notes (Signed)
12  

## 2011-04-15 ENCOUNTER — Encounter: Payer: Self-pay | Admitting: Internal Medicine

## 2011-04-15 ENCOUNTER — Ambulatory Visit (INDEPENDENT_AMBULATORY_CARE_PROVIDER_SITE_OTHER): Payer: Medicare Other | Admitting: Internal Medicine

## 2011-04-15 DIAGNOSIS — E039 Hypothyroidism, unspecified: Secondary | ICD-10-CM

## 2011-04-15 DIAGNOSIS — F0390 Unspecified dementia without behavioral disturbance: Secondary | ICD-10-CM

## 2011-04-15 MED ORDER — THYROID 120 MG PO TABS: 120.0000 mg | ORAL_TABLET | ORAL | Status: DC

## 2011-04-15 NOTE — Patient Instructions (Signed)
Please  schedule fasting Labs in 10 weeks :  TSH. PLEASE BRING THESE INSTRUCTIONS TO FOLLOW UP  LAB APPOINTMENT.This will guarantee correct labs are drawn, eliminating need for repeat blood sampling ( needle sticks ! ). Diagnoses /Codes: 244.9

## 2011-04-15 NOTE — Progress Notes (Signed)
  Subjective:    Patient ID: Monica Skinner, female    DOB: 05-15-1929, 76 y.o.   MRN: [id number]  HPInThyroid function monitor  Medications status(change in dose/brand/mode of administration):see dose change & results Constitutional: Weight change: no; Fatigue:no; Sleep pattern:good; Appetite:good  Visual change(blurred/diplopia/visual loss):no Hoarseness:no; Swallowing issues:no Cardiovascular: Palpitations:no; Racing:no; Irregularity:no GI: Constipation:no; Diarrhea:no Derm: Change in nails/hair/skin:no Neuro: Numbness/tingling:no; Tremor:no. Husband asks about Namenda as mentation issues appear to be subtly worse Psych: Anxiety:no; Depression:no. Endo: Temperature intolerance: Heat:no; Cold:no Lab: TSH has decreased from 0.04 to 0.02 despite DECREASING dose of Armour 120 mg  to 1 daily except 1/2 T & Th      Review of Systems     Objective:   Physical Exam Gen.: Thin but well-nourished; in no acute distress Eyes: Extraocular motion intact; no lid lag or proptosis; ptosis OD > OS Heart: Normal rhythm and rate without  gallop, or extra heart sounds. Grade 1.5 crescendo / decrescendo systolic  Murmur @ base Lungs: Chest clear to auscultation without rales,rales, wheezes Neuro:Deep tendon reflexes are equal and within normal limits; no tremor  Skin: Warm and dry without significant lesions or rashes; no onycholysis Psych: Normally communicative and interactive; no abnormal mood or affect clinically. MMSE:  22 of 30; 17 on 01/21/2011        Assessment & Plan:  #1 hypothyroidism, over corrected #2 dementia; subjective subtle progression on Aricept, yet MMSE improved. According to the most recent literature; addition of Namenda would not be of benefit. Plan: adjust Armour dose. Neurology assessment

## 2011-04-17 ENCOUNTER — Encounter: Payer: Self-pay | Admitting: Neurology

## 2011-05-29 ENCOUNTER — Ambulatory Visit: Payer: Medicare Other | Admitting: Neurology

## 2011-06-23 ENCOUNTER — Other Ambulatory Visit: Payer: Self-pay | Admitting: Internal Medicine

## 2011-06-23 DIAGNOSIS — E039 Hypothyroidism, unspecified: Secondary | ICD-10-CM

## 2011-06-24 ENCOUNTER — Other Ambulatory Visit: Payer: Medicare Other

## 2011-06-24 ENCOUNTER — Other Ambulatory Visit (INDEPENDENT_AMBULATORY_CARE_PROVIDER_SITE_OTHER): Payer: Medicare Other

## 2011-06-24 DIAGNOSIS — E039 Hypothyroidism, unspecified: Secondary | ICD-10-CM

## 2011-07-16 ENCOUNTER — Ambulatory Visit (INDEPENDENT_AMBULATORY_CARE_PROVIDER_SITE_OTHER): Payer: Medicare Other | Admitting: Internal Medicine

## 2011-07-16 ENCOUNTER — Encounter: Payer: Self-pay | Admitting: Internal Medicine

## 2011-07-16 VITALS — BP 134/80 | HR 58 | Wt 139.8 lb

## 2011-07-16 DIAGNOSIS — E039 Hypothyroidism, unspecified: Secondary | ICD-10-CM

## 2011-07-16 MED ORDER — THYROID 120 MG PO TABS: ORAL_TABLET | ORAL | Status: DC

## 2011-07-16 NOTE — Progress Notes (Signed)
  Subjective:    Patient ID: Monica Skinner, female    DOB: 1929/05/20, 78 y.o.   MRN: [id number]  HPI  She is here to followup on her thyroid status; her TSH was 0.02 on 04/08/11. In September of 2012 it was 0.04. This value is on Armour Thyroid 120 mg one half pill every day except for one full tablet on Tuesday, Thursday, and Saturday.  She has not changed the dose of medication. She does take multiple supplements including flaxseed oil, fish oil, vitamin D 3, vitamin D, folic acid, and vitamin C. She is also on 81 mg of aspirin daily.    Review of Systems she denies blurred vision, double vision or loss of vision, hoarseness, difficulty swallowing, palpitations, tremor, diarrhea, or unexplained weight loss      Objective:   Physical Exam  Gen.: Thin but well-nourished; in no acute distress Eyes: Extraocular motion intact; no lid lag or proptosis Neck: Thyroid essentially not palpable; there is extensive operative scar at the base of the neck Heart: Normal rhythm and slow  rate without significant murmur, gallop, or extra heart sounds Lungs: Chest clear to auscultation without rales,rales, wheezes Neuro:Deep tendon reflexes are equal and within normal limits; no tremor  Skin: Warm and dry without significant lesions or rashes; no onycholysis Psych: Normally communicative and interactive; no abnormal mood or affect clinically.         Assessment & Plan:

## 2011-07-16 NOTE — Patient Instructions (Signed)
TSH (Thyroid Stimulating Hormone) normal range = 0.35- 5.50. Ideal value is 1-3. A  Value below 0.35 indicates excessive thyroid supplementation (HYPERthyroid state)   PLEASE BRING THESE INSTRUCTIONS TO FOLLOW UP  LAB APPOINTMENT in 10 weeks.This will guarantee correct labs are drawn, eliminating need for repeat blood sampling ( needle sticks ! ). Diagnoses /Codes: 244.9.   Take either fish oil or flaxseed oil. Discontinue vitamin E and vitamin C supplements.

## 2011-07-16 NOTE — Assessment & Plan Note (Signed)
Her TSH is too low and increases the risk of exacerbating her osteoporosis. It is doubtful that the supplements to her interfering with her Armour Thyroid but there is  duplication of therapy of  supplements.  Armour Thyroid will be decreased to one half pill daily with repeat TSH in 10 weeks.

## 2011-07-29 ENCOUNTER — Other Ambulatory Visit: Payer: Self-pay | Admitting: Internal Medicine

## 2011-12-31 ENCOUNTER — Other Ambulatory Visit: Payer: Self-pay | Admitting: Internal Medicine

## 2012-03-06 ENCOUNTER — Other Ambulatory Visit: Payer: Self-pay | Admitting: Internal Medicine

## 2012-05-29 ENCOUNTER — Other Ambulatory Visit: Payer: Self-pay | Admitting: Internal Medicine

## 2012-05-31 NOTE — Telephone Encounter (Signed)
TSH 244.9 

## 2012-06-19 IMAGING — CT CT HEAD W/O CM
1 series · 16 of 30 positions shown, 20 images · non-contrast
Comparison: None.

CLINICAL DATA: Memory loss.  Dizziness.  History of breast cancer.

CT HEAD WITHOUT CONTRAST
TECHNIQUE: Contiguous axial images were obtained from the base of
the skull through the vertex without contrast.

[Series 2: head_seq -c 4.5 h37s st · axial · 0.43mm/px · z∈[+1387,+1517]mm · 16 of 32 slices shown, 20 images]
[im 2/32  brain]
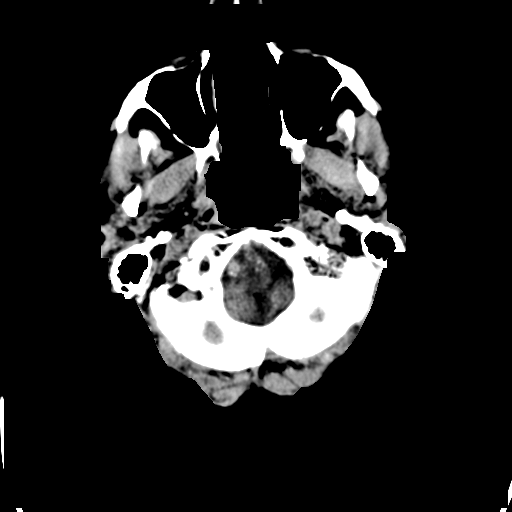
[im 2/32  bone]
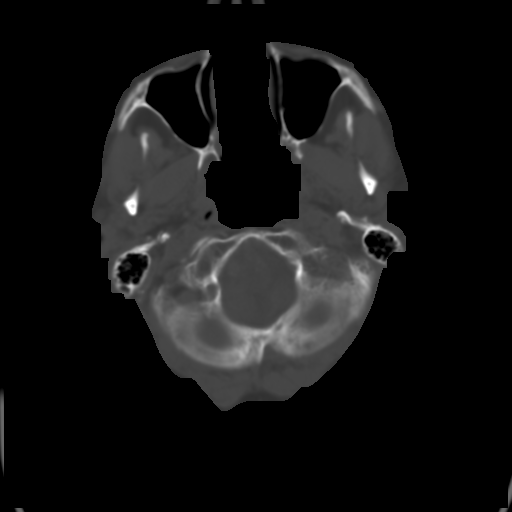
[im 4/32  brain]
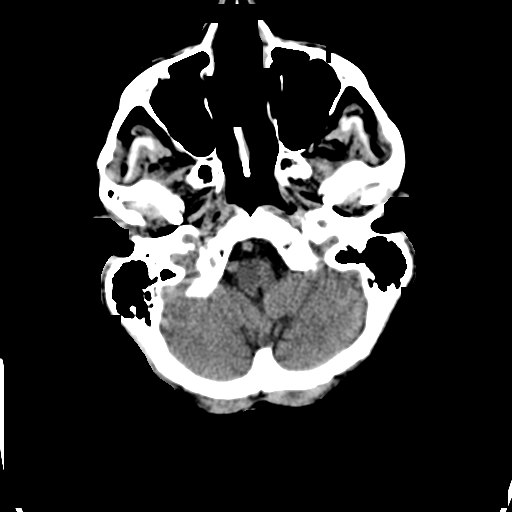
[im 6/32  brain]
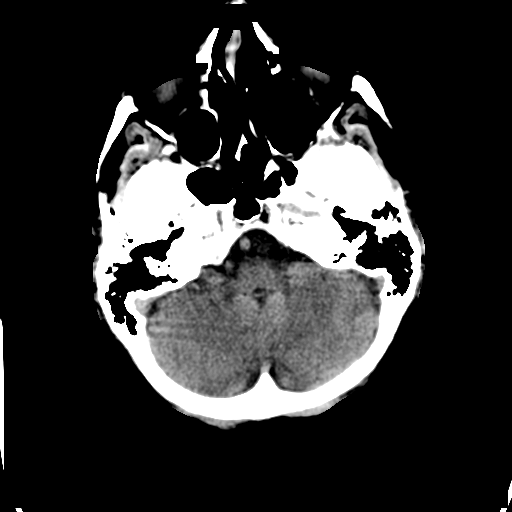
[im 8/32  brain]
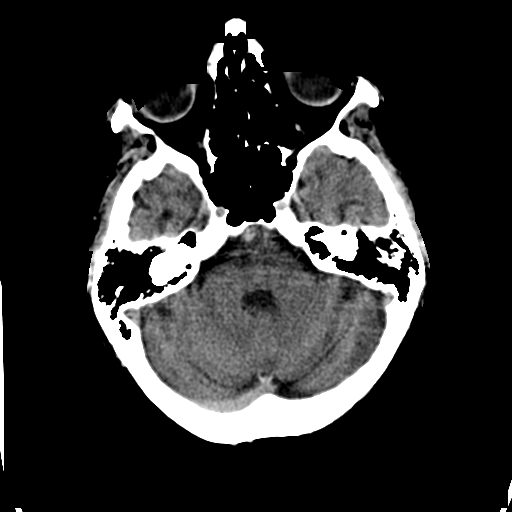
[im 9/32  brain]
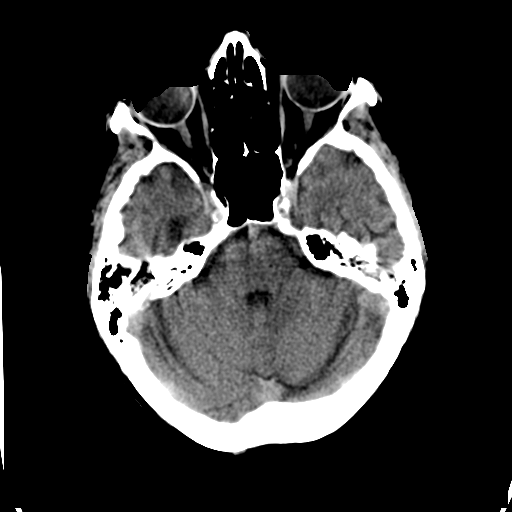
[im 9/32  bone]
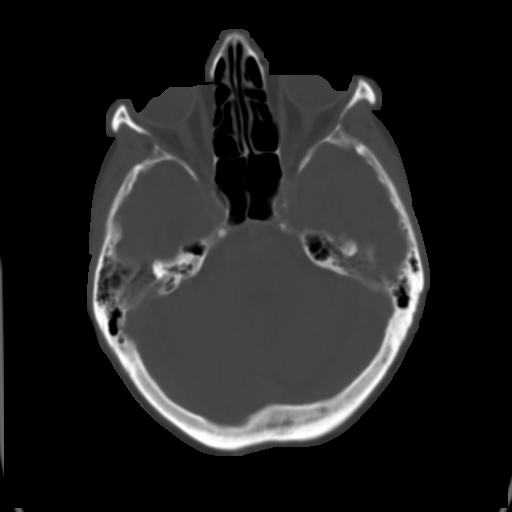
[im 11/32  brain]
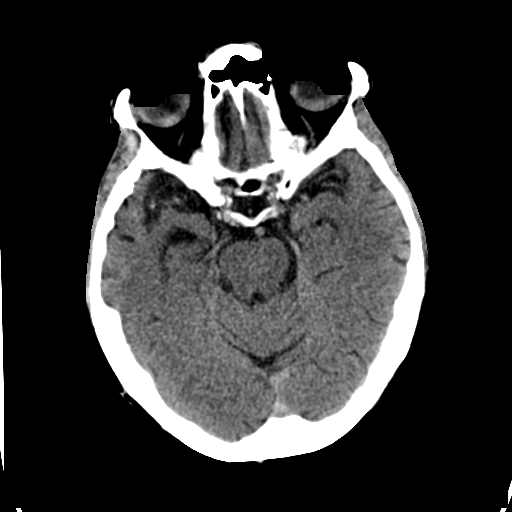
[im 13/32  brain]
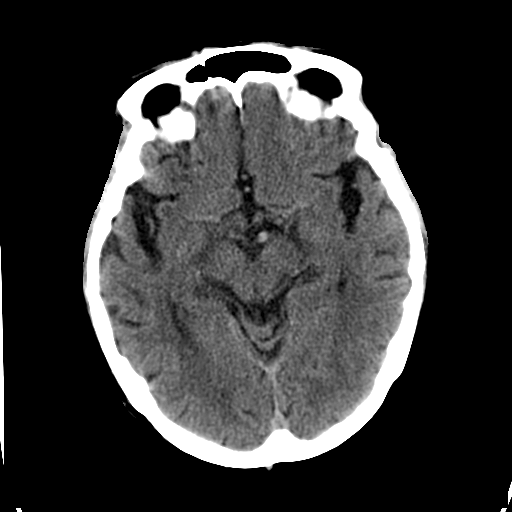
[im 15/32  brain]
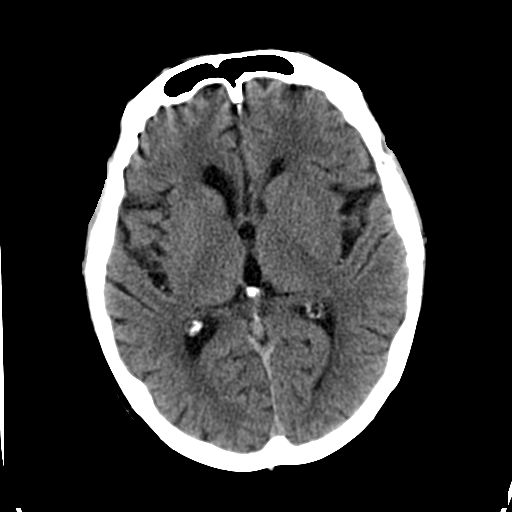
[im 17/32  brain]
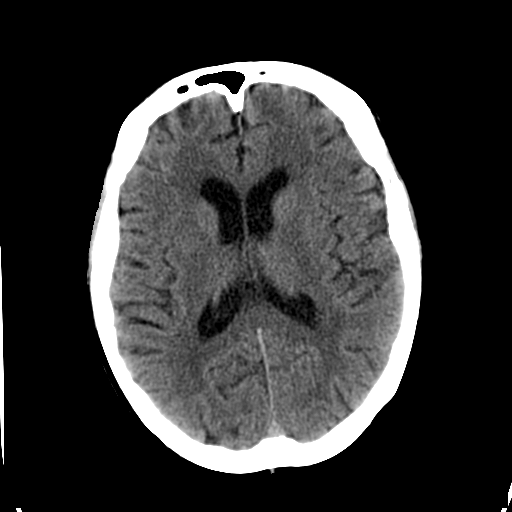
[im 17/32  bone]
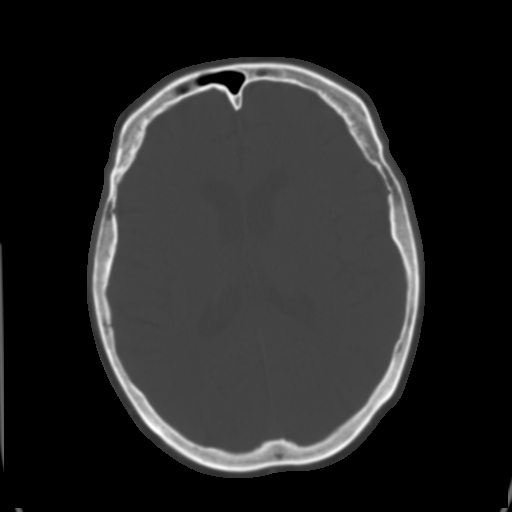
[im 19/32  brain]
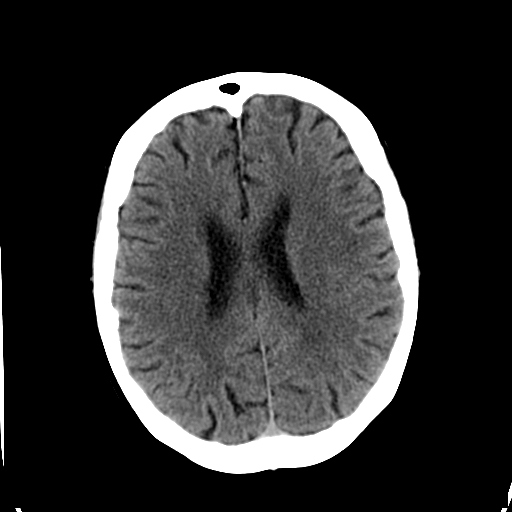
[im 21/32  brain]
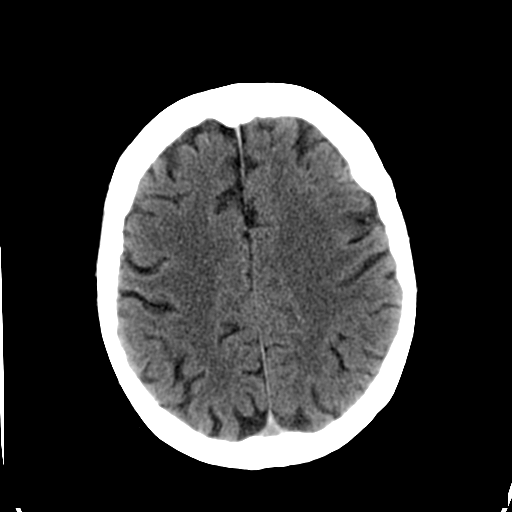
[im 23/32  brain]
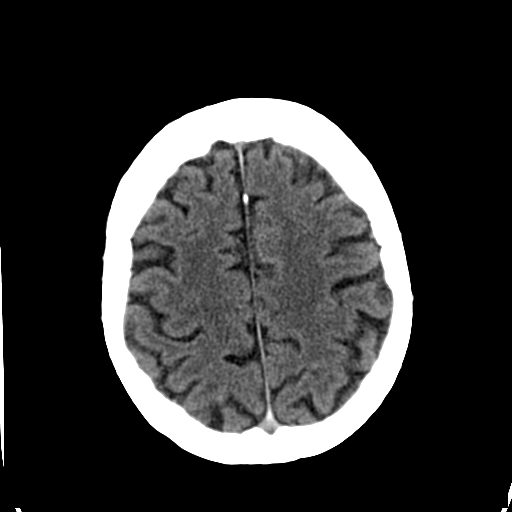
[im 24/32  brain]
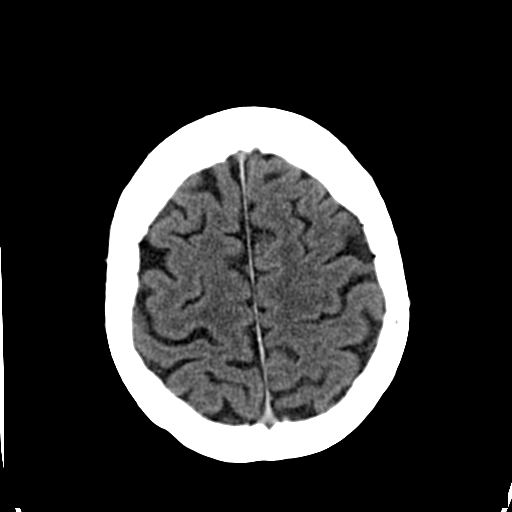
[im 24/32  bone]
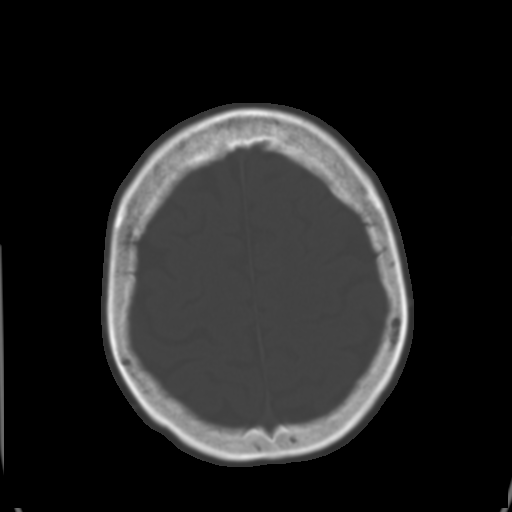
[im 26/32  brain]
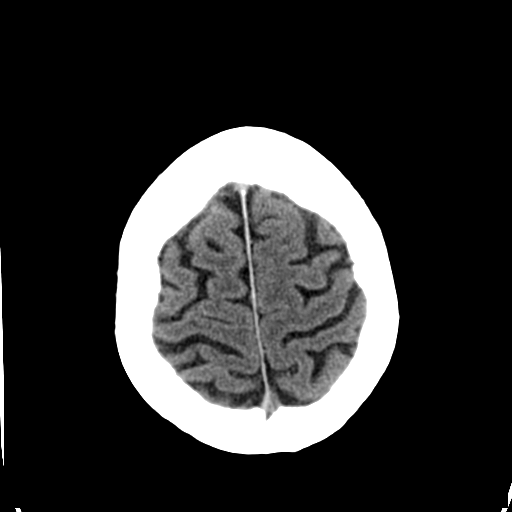
[im 28/32  brain]
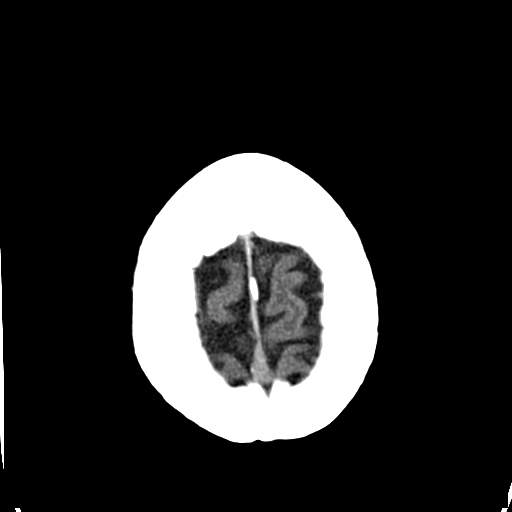
[im 30/32  brain]
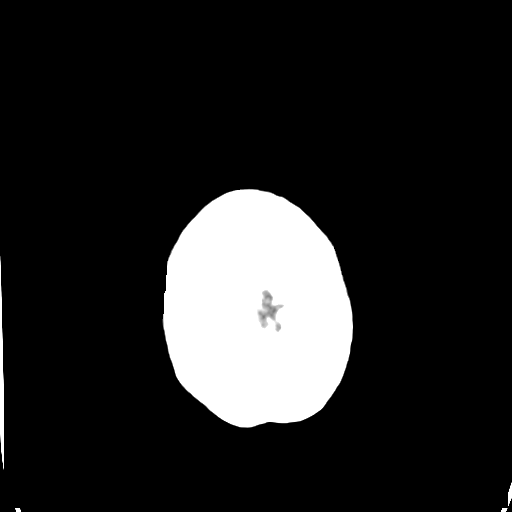

[16 of 30 positions shown; findings below may reference images not displayed]

FINDINGS: No intracranial hemorrhage. No CT evidence of large
acute infarct.  Small acute infarct cannot be excluded by CT. No
intracranial mass detected on this unenhanced exam.

Mild global age related atrophy without hydrocephalus.

Ectatic intracranial vasculature which is partially calcified.

Small vessel disease type changes.
IMPRESSION: No intracranial hemorrhage or CT evidence of large acute infarct.

Small vessel disease type changes.

Mild age related global atrophy without hydrocephalus.

Intracranial vascular calcifications.

## 2012-09-04 ENCOUNTER — Other Ambulatory Visit: Payer: Self-pay | Admitting: Internal Medicine

## 2012-09-21 ENCOUNTER — Telehealth: Payer: Self-pay | Admitting: *Deleted

## 2012-09-21 NOTE — Telephone Encounter (Signed)
Pt sone left VM that Pt is currently out of thyroid med and that he would like to get a home health referral for Pt. Pt son notes that Pt husband just had neck surgery and is not going to be able to care for Pt. [facility] Last OV 07-16-11 , last filled 05-31-12 #30 last TSH 06-24-11 0.33.Marland KitchenPlease advise    Left message to call office on Pt and son phone to verify message.

## 2012-09-21 NOTE — Telephone Encounter (Signed)
Change Armour Thyroid to 60 mg, one pill daily except one half on Wednesday. #30 refill x2.  Repeat TSH in 10 weeks. Code 244.9.  Home health nurse consult appropriate in view of her dementia and inability to manage medications.

## 2012-09-22 MED ORDER — THYROID 60 MG PO TABS: 60.0000 mg | ORAL_TABLET | Freq: Every day | ORAL | Status: DC

## 2012-09-22 NOTE — Telephone Encounter (Signed)
Spoke with Pt who verbally advise me to speak with Lorin Picket about these concern. Advise Scott appt schedule to come in to see Dr Alwyn Ren and have labs done

## 2012-10-01 ENCOUNTER — Telehealth: Payer: Self-pay | Admitting: Internal Medicine

## 2012-10-01 NOTE — Telephone Encounter (Signed)
Caller Name: Velna Hatchet (daughter-in-law)  Phone: [phone]  Patient: Monica Skinner  Gender: Female  DOB: 1929/12/14  Age: 79 Years  PCP: Marga Melnick   Does the office need to follow up with this patient?: No  RN Note:  review of medications per EPIC, concern if she is using the Aricept or not.   Reason For Call & Symptoms: call prior to office visit 10/05/12. husband now in hospital. onset of congestion, hoarse  Reviewed Health History In EMR: Yes  Reviewed Medications In EMR: Yes  Reviewed Allergies In EMR: Yes  Reviewed Surgeries / Procedures: Yes  Date of Onset of Symptoms: 09/30/2012  Guideline(s) Used:  Colds  Disposition Per Guideline:  Home Care  Reason For Disposition Reached:  Colds with no complications  Advice Given:  Reassurance  Colds are very common and may make you feel uncomfortable.  Here is some care advice that should help.  Pain and Fever Medicines:  For pain or fever relief, take either acetaminophen.  Call Back If:  Difficulty breathing occurs;  You become worse  Patient Will Follow Care Advice:  YES

## 2012-10-01 NOTE — Telephone Encounter (Signed)
Called Daughter in-law back. States that due to age she was advised to seek pharmacy assistance to treat with OTC. Pt has an appt on 10-05-12 and was advised that Ninfa Meeker has Saturday CLinic if pt symptoms worsen.

## 2012-10-05 ENCOUNTER — Ambulatory Visit (INDEPENDENT_AMBULATORY_CARE_PROVIDER_SITE_OTHER): Payer: Medicare Other | Admitting: Internal Medicine

## 2012-10-05 ENCOUNTER — Encounter: Payer: Self-pay | Admitting: Internal Medicine

## 2012-10-05 ENCOUNTER — Telehealth: Payer: Self-pay | Admitting: Internal Medicine

## 2012-10-05 VITALS — BP 132/84 | HR 73 | Resp 14 | Wt 156.0 lb

## 2012-10-05 DIAGNOSIS — Z853 Personal history of malignant neoplasm of breast: Secondary | ICD-10-CM

## 2012-10-05 DIAGNOSIS — F0280 Dementia in other diseases classified elsewhere without behavioral disturbance: Secondary | ICD-10-CM

## 2012-10-05 DIAGNOSIS — G309 Alzheimer's disease, unspecified: Secondary | ICD-10-CM | POA: Insufficient documentation

## 2012-10-05 DIAGNOSIS — E785 Hyperlipidemia, unspecified: Secondary | ICD-10-CM

## 2012-10-05 DIAGNOSIS — I10 Essential (primary) hypertension: Secondary | ICD-10-CM

## 2012-10-05 DIAGNOSIS — E039 Hypothyroidism, unspecified: Secondary | ICD-10-CM

## 2012-10-05 LAB — CBC WITH DIFFERENTIAL/PLATELET
Basophils Relative: 0.3 % (ref 0.0–3.0)
Eosinophils Absolute: 0.1 10*3/uL (ref 0.0–0.7)
Hemoglobin: 12.8 g/dL (ref 12.0–15.0)
MCHC: 33.4 g/dL (ref 30.0–36.0)
MCV: 92 fl (ref 78.0–100.0)
Monocytes Absolute: 0.6 10*3/uL (ref 0.1–1.0)
Neutro Abs: 4.4 10*3/uL (ref 1.4–7.7)
Neutrophils Relative %: 71.4 % (ref 43.0–77.0)
RBC: 4.17 Mil/uL (ref 3.87–5.11)

## 2012-10-05 LAB — HEPATIC FUNCTION PANEL
Albumin: 4 g/dL (ref 3.5–5.2)
Total Protein: 7.2 g/dL (ref 6.0–8.3)

## 2012-10-05 LAB — BASIC METABOLIC PANEL
CO2: 30 mEq/L (ref 19–32)
Calcium: 9.7 mg/dL (ref 8.4–10.5)
Glucose, Bld: 76 mg/dL (ref 70–99)
Sodium: 139 mEq/L (ref 135–145)

## 2012-10-05 NOTE — Assessment & Plan Note (Signed)
At her advanced age and with severe dementia; management of lipids will be deferred

## 2012-10-05 NOTE — Telephone Encounter (Signed)
Caller: Grandson/Other; Phone: [phone]; Reason for Call: Caller states pt has appt scheduled for today (10/05/12) at 1100.  Caller wants to k now if pt can eat breakfast.  RN viewed appt in EPIC and appt said pt would be fasting.  RN advised for pt to not eat before appt.  Caller verbalized understanding.

## 2012-10-05 NOTE — Progress Notes (Signed)
  Subjective:    Patient ID: Monica Skinner, female    DOB: [birth year], 79 y.o.   MRN: [id number]  HPI   Her husband who is her caregiver apparently sustained a cervical injury requiring fusion. He is now in rehabilitation. Family members are providing supervision administration of medications; but they've requested home health involvement.  According to [place], her grandson, her dementia has progressed significantly in the last few months. She is not combative but at times is somewhat argumentative when asked to perform certain tasks .  Family did not  realize she was on Aricept 10; she had missed a few doses recently.    Review of Systems she has no  complaints     Objective:   Physical Exam Gen.:  well-nourished in appearance. Alert, appropriate and cooperative throughout exam. Head: Normocephalic without obvious abnormalities Eyes: No corneal or conjunctival inflammation noted. Ptosis OD . No lid lag or proptosis. Extraocular motion intact. Vision grossly normal with lenses Mouth: Oral mucosa and oropharynx reveal no lesions or exudates.  Neck: No deformities, masses, or tenderness noted.  Thyroid : R lobe absent. Lungs: Normal respiratory effort; chest expands symmetrically. Lungs are clear to auscultation without rales, wheezes, or increased work of breathing. Heart: Normal rate and rhythm. Normal S1 and S2. No gallop, click, or rub. S4 w/o murmur. Abdomen: Bowel sounds normal; abdomen soft and nontender. No masses, organomegaly or hernias noted.Aorta palpable ; no AAA                                 Musculoskeletal/extremities: significant accentuation of curvature of upper thoracic  Spine. Joints  reveal mixed DIP & PIP changes changes. Nail health good. Able to lie down & sit up w/o help.  Vascular: Carotid, radial artery, dorsalis pedis and  posterior tibial pulses are full and equal. No bruits present. Neurologic: Disoriented x3. Deep tendon reflexes symmetrical and normal. She  repetitively relates that she is actively playing basketball and serving as a Runner, broadcasting/film/video. She is unable to tell me where her husband is & why he is not home. She could name her grandson Lorin Picket who is her healthcare power of attorney but could not define their relationship or why he was here.        Skin: Intact without suspicious lesions or rashes. Lymph: No cervical, axillary lymphadenopathy present. Psych: Mood and affect are normal. Normally interactive                                                                                        Assessment & Plan:  See Current Assessment & Plan in Problem List under specific Diagnosis

## 2012-10-05 NOTE — Assessment & Plan Note (Addendum)
TSH will be performed; because of her osteoporosis ,goal will be TSH in the 1-3 range.

## 2012-10-05 NOTE — Patient Instructions (Addendum)
Please review the medication list in the After Visit Summary provided.Please verify the medication name (this may be  brand or generic) & correct dosage. Write the name of the prescribing physician to the right of the medication and share this with all medical staff seen at each appointment. This will help provide continuity of care; help optimize therapeutic interventions;and help prevent drug:drug adverse reaction.  If you activate the  My Chart system; lab & Xray results will be released directly  to you as soon as I review & address these through the computer. If you choose not to sign up for My Chart within 36 hours of labs being drawn; results will be reviewed & interpretation added before being copied & mailed, causing a delay in getting the results to you.If you do not receive that report within 7-10 days ,please call. Additionally you can use this system to gain direct  access to your records  if  out of town or @ an office of a  physician who is not in  the My Chart network.  This improves continuity of care & places you in control of your medical record.  Unfortunately the dementia has progressed significantly in the last several months despite compliance with the Aricept except for a few missed doses recently during her husband's absence for rehab post op. She is completely unable to provide for herself. She needs supervision in activities of daily living and medication supervision. The dementia is severe enough that she is unable to recognize her grandson but unable to identify him as such. She is unable to provide the year or the name of the President. There are no significant behavioral issues at this time thankfully.  10/18/2012 Home Health Certification  & Plan of Care  For 10/02/12-11/30/12  reviewed & completed. Discrepancies noted between EMR Med List & meds listed on form . Handwritten notes made on form with  request made for repeat vit D & TSH in mid August 2014.

## 2012-10-05 NOTE — Assessment & Plan Note (Addendum)
Her dementia has progressed in the last several months according to her family. Behavioral issues are not major at this time. Neurology referral should be considered if the situation does not stabilize with home health involvement in the absence of her husband.  She is unable to conduct activities of daily living or manage medications.  Reagen Goates, [facility]  [phone] [address] [place] , [place], [place] [address]

## 2012-10-05 NOTE — Assessment & Plan Note (Addendum)
As per  [facility]. CBC and differential and hepatic profile will be checked

## 2012-10-05 NOTE — Telephone Encounter (Signed)
Noted  

## 2012-10-05 NOTE — Assessment & Plan Note (Signed)
Blood pressure appears to be well controlled. Renal function will be checked

## 2012-10-06 ENCOUNTER — Encounter: Payer: Self-pay | Admitting: *Deleted

## 2012-10-17 ENCOUNTER — Other Ambulatory Visit: Payer: Self-pay | Admitting: Internal Medicine

## 2012-10-17 DIAGNOSIS — E039 Hypothyroidism, unspecified: Secondary | ICD-10-CM

## 2012-10-17 DIAGNOSIS — E559 Vitamin D deficiency, unspecified: Secondary | ICD-10-CM

## 2012-10-18 ENCOUNTER — Telehealth: Payer: Self-pay | Admitting: Internal Medicine

## 2012-10-18 DIAGNOSIS — Z9181 History of falling: Secondary | ICD-10-CM

## 2012-10-18 DIAGNOSIS — F0390 Unspecified dementia without behavioral disturbance: Secondary | ICD-10-CM

## 2012-10-18 DIAGNOSIS — M810 Age-related osteoporosis without current pathological fracture: Secondary | ICD-10-CM

## 2012-10-18 DIAGNOSIS — M129 Arthropathy, unspecified: Secondary | ICD-10-CM

## 2012-10-18 NOTE — Telephone Encounter (Signed)
Last OV 10/05/2012, Hopp please advise

## 2012-10-18 NOTE — Telephone Encounter (Signed)
Pts son Aurther Loft called stating that we were supposed to send a letter regarding the patient's decline so they can get more help. They need a letter stating that her dementia is advanced and she is unable to perform ADLs. He would like this mailed to  [facility] [address] [place], [place] [address]

## 2012-10-19 ENCOUNTER — Encounter: Payer: Self-pay | Admitting: Internal Medicine

## 2012-10-19 NOTE — Telephone Encounter (Signed)
Left message on voicemail informing Aurther Loft letter mailed

## 2012-10-20 ENCOUNTER — Telehealth: Payer: Self-pay | Admitting: Internal Medicine

## 2012-10-20 MED ORDER — LORAZEPAM 0.5 MG PO TABS: 0.5000 mg | ORAL_TABLET | Freq: Every evening | ORAL | Status: DC | PRN

## 2012-10-20 NOTE — Telephone Encounter (Signed)
Scott/ Grandson [id number]) [id number] called to request Rx for Ativan to help Monica Skinner sleep. She is up multiple times at night; family unable to rest.  Friends suggested  Ativan is useful for management of dementia related  insomnia.  Saw Dr Alwyn Ren on 10/05/12; mentioned issue to the NP assisting Dr Alwyn Ren.  [facility]  Informed Dr. Alwyn Ren is out of the office 10/20/12.  Please call back.

## 2012-10-20 NOTE — Telephone Encounter (Signed)
Please advise, patient's grandson is requesting rx for sleep aid (specifically Ativan)

## 2012-10-20 NOTE — Telephone Encounter (Signed)
RX called in , I spoke with patient's grandson and informed him of specific instruction and risk provided by Dr.Tabori, Monica Skinner verbalized understanding of specific instructions and risk

## 2012-10-20 NOTE — Telephone Encounter (Signed)
Ok for Ativan 0.5mg - 1 tab QHS, #30, no refills.  May cause increased confusion and puts pt at risk for falls if she does get up.  Family needs to be aware of this and accept the risks prior to starting her on med.

## 2012-10-27 NOTE — Progress Notes (Signed)
lmovm for pt son to call office.

## 2012-10-28 NOTE — Progress Notes (Signed)
Pt scheduled  

## 2012-11-30 ENCOUNTER — Other Ambulatory Visit (INDEPENDENT_AMBULATORY_CARE_PROVIDER_SITE_OTHER): Payer: Medicare Other

## 2012-11-30 DIAGNOSIS — E039 Hypothyroidism, unspecified: Secondary | ICD-10-CM

## 2012-11-30 DIAGNOSIS — E559 Vitamin D deficiency, unspecified: Secondary | ICD-10-CM

## 2012-11-30 LAB — TSH: TSH: 34.92 u[IU]/mL — ABNORMAL HIGH (ref 0.35–5.50)

## 2012-12-04 LAB — VITAMIN D 1,25 DIHYDROXY
Vitamin D 1, 25 (OH)2 Total: 61 pg/mL (ref 18–72)
Vitamin D3 1, 25 (OH)2: 61 pg/mL

## 2012-12-06 ENCOUNTER — Telehealth: Payer: Self-pay

## 2012-12-06 MED ORDER — LEVOTHYROXINE SODIUM 25 MCG PO TABS: ORAL_TABLET | ORAL | Status: AC

## 2012-12-06 NOTE — Telephone Encounter (Signed)
Message copied by Maurice Small on Mon Dec 06, 2012  3:48 PM ------      Message from: Pecola Lawless      Created: Tue Nov 30, 2012  1:26 PM       Please send new  Rx for Synthroid 25 mcg daily  # 90.(STOP Armour thyroid). Recheck TSH in 8 weeks ( code 244.9) ------

## 2012-12-06 NOTE — Telephone Encounter (Signed)
RX sent

## 2012-12-12 ENCOUNTER — Other Ambulatory Visit: Payer: Self-pay | Admitting: Internal Medicine

## 2012-12-14 NOTE — Telephone Encounter (Signed)
Spoke with patient's husband, informed patient will need to stop by the office to sign a contract prior to refilling medication

## 2013-07-25 ENCOUNTER — Telehealth: Payer: Self-pay | Admitting: Internal Medicine

## 2013-07-25 NOTE — Telephone Encounter (Signed)
Patient's son Coralyn Mark is calling because he says that the patient has a bladder infection and her caregiver who is at her home asked him to call to see if [facility] [place] [facility] [address] for it. Coralyn Mark states that it is hard for them to transport her with her alzheizmers. Please advise. Please call patient's husband back on the home number listed.

## 2013-07-26 MED ORDER — NITROFURANTOIN MONOHYD MACRO 100 MG PO CAPS: 100.0000 mg | ORAL_CAPSULE | Freq: Two times a day (BID) | ORAL | Status: AC

## 2013-07-26 NOTE — Telephone Encounter (Signed)
RX sent to pharmacy, pt husband notified

## 2013-07-26 NOTE — Telephone Encounter (Signed)
Macrobid 100 mg bid #10

## 2013-08-10 ENCOUNTER — Ambulatory Visit: Payer: Medicare Other | Admitting: Internal Medicine

## 2013-08-15 ENCOUNTER — Telehealth: Payer: Self-pay | Admitting: *Deleted

## 2013-08-15 NOTE — Telephone Encounter (Signed)
I can make referrals as requested but with her advanced health issues & inability to come to [place]; unfortunately she will need to be under the care of a physician in the local community there so she can get direct supervision

## 2013-08-15 NOTE — Telephone Encounter (Signed)
Rep from [facility] called states pt was discharged from [facility] on 4.11.15.  Referral for PT, speech, and Social Work was placed for [employer].  They are requesting ok from dr Linna Darner for services, also to advise pt had AMS and was sent to [facility] ER.  Pt is to be discharged from ER.  Please advise

## 2013-08-16 NOTE — Telephone Encounter (Signed)
Left detailed message advising of MDs message.

## 2013-08-29 ENCOUNTER — Telehealth: Payer: Self-pay | Admitting: Internal Medicine

## 2013-09-05 DIAGNOSIS — Z5189 Encounter for other specified aftercare: Secondary | ICD-10-CM

## 2013-09-05 DIAGNOSIS — R262 Difficulty in walking, not elsewhere classified: Secondary | ICD-10-CM

## 2013-11-02 DEATH — deceased

## 2018-08-29 ENCOUNTER — Encounter: Payer: Self-pay | Admitting: Internal Medicine
